# Patient Record
Sex: Male | Born: 1940 | Race: White | Hispanic: No | Marital: Single | Smoking: Never smoker
Health system: Southern US, Community
[De-identification: ages and names within clinical notes are randomized; demographics above are authoritative.]

## PROBLEM LIST (undated history)

## (undated) DIAGNOSIS — I639 Cerebral infarction, unspecified: Secondary | ICD-10-CM

## (undated) DIAGNOSIS — I10 Essential (primary) hypertension: Secondary | ICD-10-CM

## (undated) DIAGNOSIS — E781 Pure hyperglyceridemia: Secondary | ICD-10-CM

## (undated) DIAGNOSIS — F0390 Unspecified dementia without behavioral disturbance: Secondary | ICD-10-CM

## (undated) HISTORY — PX: TOTAL HIP ARTHROPLASTY: SHX124

## (undated) HISTORY — PX: HAND SURGERY: SHX662

---

## 2008-08-27 ENCOUNTER — Ambulatory Visit: Payer: Self-pay | Admitting: Orthopedic Surgery

## 2009-07-09 ENCOUNTER — Encounter: Payer: Self-pay | Admitting: Cardiovascular Disease

## 2009-07-09 LAB — CONVERTED CEMR LAB
Alkaline Phosphatase: 68 units/L
CO2: 23.7 meq/L
Calcium: 9.3 mg/dL
Chloride: 104 meq/L
Creatinine, Ser: 0.9 mg/dL
Eosinophils Relative: 0.41 %
Lymphocytes, automated: 4.97 %
Monocytes Relative: 1.23 %
Neutrophils Relative %: 6.51 %
RDW: 14.1 %
Total Bilirubin: 0.5 mg/dL

## 2009-07-14 ENCOUNTER — Telehealth: Payer: Self-pay | Admitting: Cardiovascular Disease

## 2009-07-18 ENCOUNTER — Ambulatory Visit: Payer: Self-pay | Admitting: Cardiovascular Disease

## 2009-07-18 DIAGNOSIS — R0602 Shortness of breath: Secondary | ICD-10-CM

## 2009-07-22 ENCOUNTER — Encounter: Payer: Self-pay | Admitting: Cardiovascular Disease

## 2009-07-24 ENCOUNTER — Encounter: Payer: Self-pay | Admitting: Cardiovascular Disease

## 2009-08-04 ENCOUNTER — Telehealth (INDEPENDENT_AMBULATORY_CARE_PROVIDER_SITE_OTHER): Payer: Self-pay

## 2009-08-05 ENCOUNTER — Encounter: Payer: Self-pay | Admitting: Cardiology

## 2009-08-05 ENCOUNTER — Ambulatory Visit: Payer: Self-pay | Admitting: Cardiovascular Disease

## 2009-08-05 ENCOUNTER — Encounter (HOSPITAL_COMMUNITY): Admission: RE | Admit: 2009-08-05 | Discharge: 2009-10-14 | Payer: Self-pay | Admitting: Cardiovascular Disease

## 2009-08-05 ENCOUNTER — Ambulatory Visit: Payer: Self-pay

## 2009-08-05 ENCOUNTER — Ambulatory Visit: Payer: Self-pay | Admitting: Cardiology

## 2009-08-05 ENCOUNTER — Encounter (INDEPENDENT_AMBULATORY_CARE_PROVIDER_SITE_OTHER): Payer: Self-pay | Admitting: *Deleted

## 2009-08-19 LAB — CONVERTED CEMR LAB
LDL Cholesterol: 86 mg/dL (ref 0–99)
Total CHOL/HDL Ratio: 5
Triglycerides: 195 mg/dL — ABNORMAL HIGH (ref 0.0–149.0)

## 2009-08-26 ENCOUNTER — Ambulatory Visit: Payer: Self-pay | Admitting: Cardiovascular Disease

## 2009-10-14 ENCOUNTER — Ambulatory Visit: Payer: Self-pay | Admitting: Specialist

## 2010-02-11 ENCOUNTER — Telehealth: Payer: Self-pay | Admitting: Cardiovascular Disease

## 2010-05-12 NOTE — Progress Notes (Signed)
Summary: RX  Phone Note Refill Request Call back at Home Phone [phone] Message from:  Patient on February 11, 2010 9:46 AM  Refills Requested: Medication #1:  SIMVASTATIN 20 MG TABS Take one tablet by mouth daily at bedtime.   Notes: PT CAN GET 2 OF THESE AT A TIME AND IT WILL SAVE HIM MONEY PT WOULD LIKE A WRITTEN RX-PT WOULD ALSO LIKE TO KNOW IF IT IS RECOMMENED TO DO ANOTHER SCAN OF HIS HEART  Initial call taken by: Harlon Flor,  February 11, 2010 9:47 AM    Prescriptions: SIMVASTATIN 20 MG TABS (SIMVASTATIN) Take one tablet by mouth daily at bedtime  #60 x 4   Entered by:   Bishop Dublin, CMA   Authorized by:   Dossie Arbour MD   Signed by:   Bishop Dublin, CMA on 02/12/2010   Method used:   Print then Give to Patient   RxID:   [id number]   Appended Document: RX No need for repeat CT. This is not something that should  change. If his breathing gets significantly worse (a significant change), he may want to talk with PMD or pulmonary  Appended Document: RX Attempted to notify pt.  LM with co-worker to have pt call back. EWJ  Appended Document: RX Called spoke with pt advised of Dr Windell Hummingbird recommendations.  EWJ

## 2010-05-12 NOTE — Assessment & Plan Note (Signed)
Summary: NP6   Visit Type:  new patient Referring Provider:  self Primary Provider:  n/a  CC:  short of breath at time, no cp, GERD is a plus, and no edema..  History of Present Illness: 70 year old male with past medical history of left hip replacement, carpal tunnel release, chronic right shoulder pain with history of cortisone shot, with shortness of breath over the past year.  Kelly [name] states that he exercises for 20 minutes every morning. He does curls and military lifts when his shoulder does bother him. He has not noticed any increased shortness of breath with biking. When he walks outside his house, upstairs he gets significant shortness of breath. He was seen at urgent care and was told to start a proton pump inhibitor. He states that this has helped his breathing moderately and is also help his voice which seemed to be quivering sometimes.  He states that he snores he is uncertain if he has obstructive sleep apnea. He has a has some occasional chest tightness. He does not take any blood pressure medications and does not check his blood pressure at home.  He has never smoked before, does not have diabetes. He states that his mother and father did not have any underlying coronary artery disease.  Preventive Screening-Counseling & Management  Alcohol-Tobacco     Alcohol drinks/day: 0     Smoking Status: never  Caffeine-Diet-Exercise     Caffeine use/day: no     Does Patient Exercise: yes      Drug Use:  no.    Current Problems (verified): 1)  Shortness of Breath  (ICD-786.05)  Current Medications (verified): 1)  Aspirin 81 Mg Tbec (Aspirin) .... Take One Tablet By Mouth Daily 2)  Prevacid 24hr 15 Mg Cpdr (Lansoprazole) .Marland Kitchen.. 1 By Mouth Once Daily  Allergies (verified): No Known Drug Allergies  Past History:  Past Surgical History: replace left hip  hand surgery  Family History: Mother: deceased 68: healthy Father: deceased 38: CHF, HTN  Social History: Full  Time Divorced  Tobacco Use - No.  Alcohol Use - no Regular Exercise - yes Drug Use - no Alcohol drinks/day:  0 Smoking Status:  never Caffeine use/day:  no Does Patient Exercise:  yes Drug Use:  no  Review of Systems       The patient complains of weight gain and dyspnea on exertion.  The patient denies fever, weight loss, vision loss, decreased hearing, hoarseness, chest pain, syncope, prolonged cough, abdominal pain, incontinence, muscle weakness, depression, and enlarged lymph nodes.    Vital Signs:  Patient profile:   70 year old male Height:      74 inches Weight:      261.50 pounds BMI:     33.70 Pulse rate:   64 / minute Pulse rhythm:   regular BP sitting:   138 / 90  (left arm) Cuff size:   large  Vitals Entered By: Mercer Pod (July 18, 2009 10:32 AM)  Physical Exam  General:  well-appearing middle-aged gentleman in no apparent distress, HEENT exam is benign, oropharynx is clear, neck is supple with no JVP or carotid bruits, heart sounds are regular with S1-S2 and no murmurs appreciated, lungs are clear to auscultation with no wheezes Rales, abdominal exam is benign, no significant edema, neurologic exam is grossly nonfocal and skin is warm and dry, pulses are equal and symmetrical in his upper and lower extremities.    EKG  Procedure date:  07/18/2009  Findings:  normal sinus rhythm with rate 80 beats per minute, nonspecific intraventricular conduction delay, left axis deviation  Impression & Recommendations:  Problem # 1:  SHORTNESS OF BREATH (ICD-786.05) etiology of his shortness of breath is uncertain. He does have an abnormal EKG. His shortness of breath seems to be new within the past month or so. He does do some exercise though he states that his bike at home does not have any resistance. His weight has been increasing over the past several years. He does also have some occasional chest tightness with exertion.  Given his EKG changes, H.,  shortness of breath and chest symptoms, we have ordered a stress test. We will discuss the findings with him and check his cholesterol at the same time.  We have asked him to monitor his blood pressure as his diastolic pressure is borderline elevated. He states at home it is typically well controlled.  He is currently on aspirin. No smoking history.  His updated medication list for this problem includes:    Aspirin 81 Mg Tbec (Aspirin) .Marland Kitchen... Take one tablet by mouth daily  Patient Instructions: 1)  Your physician recommends that you schedule a follow-up appointment in: May 2011 2)  Your physician recommends that you return for a FASTING lipid profile: LIPID and CMET depending on results from [facility] 3)  Your physician has requested that you have an exercise stress myoview.  For further information please visit [email or web address]  Please follow instruction sheet, as given.

## 2010-05-12 NOTE — Progress Notes (Signed)
Summary: Work-in  Phone Note Call from Patient Call back at [employer] [phone]   Caller: Daughter Call For: Mariah Milling Summary of Call: Patient's daughter called today wanting patient seen this week, he would be a new patient.  He was seen at Urgent Care last week at hospital and still is having problems with shortness of breath.  Daugthter thinks it might be cardiac related and wanted to know if you could work patient in this week to be seen?  Was also told at urgent care that he had elevated BP, patient doesn't have primary MD and has never seen a cardiologist before. Initial call taken by: [place],  July 14, 2009 10:08 AM  Follow-up for Phone Call        Called patient and he will be a work-in on Friday morning.

## 2010-05-12 NOTE — Progress Notes (Signed)
Summary: Nuc. Pre-Procedure  Phone Note Outgoing Call Call back at [facility] Phone [phone]   Call placed by: Irean Hong, RN,  August 04, 2009 2:25 PM Summary of Call: Left message with information on Myoview Information Sheet (see scanned document for details).      Nuclear Med Background Indications for Stress Test: Evaluation for Ischemia, Abnormal EKG     Symptoms: Chest Tightness, Chest Tightness with Exertion, DOE  Symptoms Comments: Chronic shoulder pain.   Nuclear Pre-Procedure Height (in): 74

## 2010-05-12 NOTE — Assessment & Plan Note (Signed)
Summary: rov   Visit Type:  rov Referring Provider:  self Primary Provider:  n/a  CC:  short of breath.  History of Present Illness: 70 year old male with past medical history of left hip replacement, carpal tunnel release, chronic right shoulder pain with history of cortisone shot, with shortness of breath over the past year.  Kelly [name] returns for followup of his stress test. He treadmill for a little bit over 4 minutes, did not reach peak heart rate, only achieve 76% of his maximum predicted heart rate. There was no significant ischemia noted. He felt very short of breath on the treadmill and was unable to go any further.  He states that he continues to have shortness of breath with exertion. This is new in the past several months and he is quite bothered by it. He states that his weight has not changed significantly over the past year.  He is concerned about an x-ray that was done at urgent care that showed a elevated right hemidiaphragm and he wonders if this could be contributing to his symptoms.  He has never smoked before, does not have diabetes. He states that his mother and father did not have any underlying coronary artery disease.  Current Problems (verified): 1)  Shortness of Breath  (ICD-786.05)  Current Medications (verified): 1)  Aspirin 81 Mg Tbec (Aspirin) .... Take One Tablet By Mouth Daily 2)  Prevacid 24hr 15 Mg Cpdr (Lansoprazole) .Marland Kitchen.. 1 By Mouth Once Daily  Allergies (verified): No Known Drug Allergies  Past History:  Past Surgical History: Last updated: August 03, 2009 replace left hip  hand surgery  Family History: Last updated: Aug 03, 2009 Mother: deceased 77: healthy Father: deceased 13: CHF, HTN  Social History: Last updated: August 03, 2009 Full Time Divorced  Tobacco Use - No.  Alcohol Use - no Regular Exercise - yes Drug Use - no  Risk Factors: Alcohol Use: 0 (08/03/2009) Caffeine Use: no (August 03, 2009) Exercise: yes (August 03, 2009)  Risk  Factors: Smoking Status: never (Aug 03, 2009)  Review of Systems       The patient complains of dyspnea on exertion.  The patient denies fever, weight loss, weight gain, vision loss, decreased hearing, hoarseness, chest pain, syncope, peripheral edema, prolonged cough, abdominal pain, incontinence, muscle weakness, depression, and enlarged lymph nodes.    Vital Signs:  Patient profile:   70 year old male Height:      74 inches Weight:      268.50 pounds BMI:     34.60 Pulse rate:   64 / minute Pulse rhythm:   regular BP sitting:   140 / 90  (left arm) Cuff size:   large  Vitals Entered By: Mercer Pod (Aug 26, 2009 2:26 PM)  Physical Exam  General:  Middle-aged gentleman in no apparent distress, HEN TMs benign oropharynx is clear, neck is supple with no JVP or carotid bruits, heart sounds are regular with S1-S2 and no murmurs appreciated, lungs are clear to auscultation with no wheezes or rales, he does have low breath sounds on the left compared to the right at the bases, abdominal exam is benign, no significant lower extremity edema, equal and symmetrical pulses in his upper and lower extremities.   Impression & Recommendations:  Problem # 1:  SHORTNESS OF BREATH (ICD-786.05) etiology of his shortness of breath is uncertain. I am concerned about the elevated right hemidiaphragm and possible pleural effusion. He states a white area was seen on his chest x-ray at the right diaphragm region. I will order a CT scan with  noncontrast to be done as soon as possible.   I've asked him to start aspirin daily basis.  His updated medication list for this problem includes:    Aspirin 81 Mg Tbec (Aspirin) .Marland Kitchen... Take two tablets by mouth daily  Orders: CT Scan  (CT Scan)  Patient Instructions: 1)  Non-Cardiac CT scanning, (CAT scanning), is a noninvasive, special x-ray that produces cross-sectional images of the body using x-rays and a computer. CT scans help physicians diagnose and  treat medical conditions. CT scans provide greater clarity and reveal more details than regular x-ray exams. Scheduled for today 08/26/09 at [facility]. 2)  CT scan results show mildly elevated right hemidiaphragm.  There is no fluid present in the lung.  CT also shows some coronary artery disease, so Dr Mariah Milling would like for you to start taking Simvastatin 20mg  once daily and start taking 81mg  ASA 2 tablets daily. Prescriptions: SIMVASTATIN 20 MG TABS (SIMVASTATIN) Take one tablet by mouth daily at bedtime  #30 x 6   Entered by:   Cloyde Reams RN   Authorized by:   Dossie Arbour MD   Signed by:   Cloyde Reams RN on 08/27/2009   Method used:   Print then Give to Patient   RxID:   [id number]

## 2010-05-12 NOTE — Letter (Signed)
Summary: Outpatient Coinsurance Notice   Outpatient Coinsurance Notice   Imported By: Marylou Mccoy 08/08/2009 16:48:44  _____________________________________________________________________  External Attachment:    Type:   Image     Comment:   External Document

## 2010-05-12 NOTE — Progress Notes (Signed)
Summary: PHI  PHI   Imported By: Harlon Flor 07/24/2009 11:49:36  _____________________________________________________________________  External Attachment:    Type:   Image     Comment:   External Document

## 2010-05-12 NOTE — Assessment & Plan Note (Signed)
Summary: Cardiology Nuclear Study  Nuclear Med Background Indications for Stress Test: Evaluation for Ischemia, Abnormal EKG     Symptoms: Chest Tightness, Chest Tightness with Exertion, DOE, Palpitations  Symptoms Comments: Chronic shoulder pain.   Nuclear Pre-Procedure Caffeine/Decaff Intake: None NPO After: 6:30 PM Lungs: clear IV 0.9% NS with Angio Cath: 20g     IV Site: (R) Hand IV Started by: Irean Hong RN Chest Size (in) 48     Height (in): 74 Weight (lb): 262 BMI: 33.76  Nuclear Med Study 1 or 2 day study:  1 day     Stress Test Type:  Eugenie Birks Reading MD:  Marca Ancona, MD     Referring MD:  T.Gollan Resting Radionuclide:  Technetium 46m Tetrofosmin     Resting Radionuclide Dose:  11 mCi  Stress Radionuclide:  Technetium 74m Tetrofosmin     Stress Radionuclide Dose:  33 mCi   Stress Protocol Exercise Time (min):  4:19 min     Max HR:  113 bpm     Predicted Max HR:  149 bpm  Max Systolic BP: 198 mm Hg     Percent Max HR:  75.84 %     METS: 6.2 Rate Pressure Product:  16109  Lexiscan: 0.4 mg   Stress Test Technologist:  Milana Na EMT-P     Nuclear Technologist:  Domenic Polite CNMT  Rest Procedure  Myocardial perfusion imaging was performed at rest 45 minutes following the intravenous administration of Myoview Technetium 34m Tetrofosmin.  Stress Procedure  The patient exercised for 4:19. The patient stopped due to fatigue and denied any chest pain.  There were no significant ST-T wave changes and occ pvcs.  Myoview was injected at peak exercise and myocardial perfusion imaging was performed after a brief delay.  QPS Raw Data Images:  Mild diaphragmatic attenuation.  Normal left ventricular size. Stress Images:  Mild inferior perfusion defect. Rest Images:  Mild inferior perfusion defect.  Subtraction (SDS):  MIld fixed inferior perfusion defect.  Transient Ischemic Dilatation:  1.03  (Normal <1.22)  Lung/Heart Ratio:  .35  (Normal  <0.45)  Quantitative Gated Spect Images QGS EDV:  115 ml QGS ESV:  57 ml QGS EF:  51 % QGS cine images:  Mild global hypokinesis.    Overall Impression  Exercise Capacity: Lexiscan study BP Response: Normal blood pressure response. Clinical Symptoms: Headache ECG Impression: NSR with RBBB. No significant change with infusion.  Overall Impression: Mild fixed inferior perfusion defect most likely is due to diaphragmatic attenuation.  No ischemia.  Low risk study.  Overall Impression Comments: Mild global hypokinesis with EF 51%.   Appended Document: Cardiology Nuclear Study Pt aware of result. EWJ

## 2010-10-22 ENCOUNTER — Encounter: Payer: Self-pay | Admitting: Cardiovascular Disease

## 2010-10-26 ENCOUNTER — Ambulatory Visit (INDEPENDENT_AMBULATORY_CARE_PROVIDER_SITE_OTHER): Payer: Medicare Other | Admitting: Cardiovascular Disease

## 2010-10-26 ENCOUNTER — Encounter: Payer: Self-pay | Admitting: Cardiovascular Disease

## 2010-10-26 DIAGNOSIS — R0602 Shortness of breath: Secondary | ICD-10-CM

## 2010-10-26 DIAGNOSIS — E785 Hyperlipidemia, unspecified: Secondary | ICD-10-CM

## 2010-10-26 DIAGNOSIS — I2510 Atherosclerotic heart disease of native coronary artery without angina pectoris: Secondary | ICD-10-CM | POA: Insufficient documentation

## 2010-10-26 NOTE — Progress Notes (Signed)
   Patient ID: Kelly [name], male    DOB: [birth year], 70 y.o.   MRN: [id number]  HPI Comments: 70 year old male with past medical history of left hip replacement, carpal tunnel release, chronic right shoulder pain with history of cortisone shot, with shortness of breath over the past year. He presents for routine followup. CT Scan of his chest showed underlying coronary artery disease.   Previous stress test in 2011:  He treadmilled for a little bit over 4 minutes, did not reach peak heart rate, only achieve 76% of his maximum predicted heart rate. There was no significant ischemia noted. He felt very short of breath on the treadmill and was unable to go any further.   His shortness of breath has been stable. He does not exercise on a regular basis. 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 No chest pain, lightheadedness dizziness. He stopped his statin on his own.   He has never smoked before, does not have diabetes. He states that his mother and father did not have any underlying coronary artery disease.  EKG shows normal sinus rhythm with rate 69 beats a minute, right bundle branch block, left axis deviation    Outpatient Encounter Prescriptions as of 10/26/2010  Medication Sig Dispense Refill  . aspirin 81 MG tablet Take 81 mg by mouth daily.         Review of Systems  Constitutional: Negative.   HENT: Negative.   Eyes: Negative.   Respiratory: Positive for shortness of breath.   Cardiovascular: Negative.   Gastrointestinal: Negative.   Musculoskeletal: Negative.   Skin: Negative.   Neurological: Negative.   Hematological: Negative.   Psychiatric/Behavioral: Negative.      BP 136/91  Pulse 69  Ht 6\' 2"  (1.88 m)  Wt 271 lb (122.925 kg)  BMI 34.79 kg/m2  Physical Exam  Nursing note and vitals reviewed. Constitutional: He is oriented to person, place, and time. He appears well-developed  and well-nourished.       obese  HENT:  Head: Normocephalic.  Nose: Nose normal.  Mouth/Throat: Oropharynx is clear and moist.  Eyes: Conjunctivae are normal. Pupils are equal, round, and reactive to light.  Neck: Normal range of motion. Neck supple. No JVD present.  Cardiovascular: Normal rate, regular rhythm, S1 normal, S2 normal, normal heart sounds and intact distal pulses.  Exam reveals no gallop and no friction rub.   No murmur heard. Pulmonary/Chest: Effort normal and breath sounds normal. No respiratory distress. He has no wheezes. He has no rales. He exhibits no tenderness.  Abdominal: Soft. Bowel sounds are normal. He exhibits no distension. There is no tenderness.  Musculoskeletal: Normal range of motion. He exhibits no edema and no tenderness.  Lymphadenopathy:    He has no cervical adenopathy.  Neurological: He is alert and oriented to person, place, and time. Coordination normal.  Skin: Skin is warm and dry. No rash noted. No erythema.  Psychiatric: He has a normal mood and affect. His behavior is normal. Judgment and thought content normal.           Assessment and Plan

## 2010-10-26 NOTE — Patient Instructions (Addendum)
You are doing well. No medication changes were made.  Please call us if you have new issues that need to be addressed before your next appt.  We will call you for a follow up Appt. In 12 months Your physician recommends that you return for a FASTING lipid profile: On Wednesday this week (lipid/lft/bmp):

## 2010-10-26 NOTE — Assessment & Plan Note (Signed)
Shortness of breath is stable. He attributes his shortness of breath to his mildly elevated right diaphragm. CT Scan was essentially normal apart from underlying coronary artery disease.

## 2010-10-26 NOTE — Assessment & Plan Note (Signed)
Cholesterol pending this week. We'll consider restarting simvastatin or Lipitor.

## 2010-10-26 NOTE — Assessment & Plan Note (Signed)
We had previously started him on a statin. He stopped this on his own. He does not have a primary care physician. We will check a cholesterol, fasting, in several days time and call him with the numbers. Ideally he should be on a statin.

## 2010-10-28 ENCOUNTER — Other Ambulatory Visit (INDEPENDENT_AMBULATORY_CARE_PROVIDER_SITE_OTHER): Payer: Medicare Other | Admitting: *Deleted

## 2010-10-28 DIAGNOSIS — E785 Hyperlipidemia, unspecified: Secondary | ICD-10-CM

## 2010-10-28 DIAGNOSIS — I2510 Atherosclerotic heart disease of native coronary artery without angina pectoris: Secondary | ICD-10-CM

## 2010-10-28 DIAGNOSIS — R0602 Shortness of breath: Secondary | ICD-10-CM

## 2010-10-29 LAB — HEPATIC FUNCTION PANEL
AST: 27 U/L (ref 0–37)
Alkaline Phosphatase: 78 U/L (ref 39–117)
Bilirubin, Direct: 0.2 mg/dL (ref 0.0–0.3)
Indirect Bilirubin: 0.5 mg/dL (ref 0.0–0.9)
Total Bilirubin: 0.7 mg/dL (ref 0.3–1.2)

## 2010-10-29 LAB — BASIC METABOLIC PANEL
BUN: 11 mg/dL (ref 6–23)
CO2: 21 mEq/L (ref 19–32)
Calcium: 8.9 mg/dL (ref 8.4–10.5)
Creat: 0.84 mg/dL (ref 0.50–1.35)
Glucose, Bld: 123 mg/dL — ABNORMAL HIGH (ref 70–99)

## 2010-10-29 LAB — LIPID PANEL: Cholesterol: 170 mg/dL (ref 0–200)

## 2010-11-09 ENCOUNTER — Telehealth: Payer: Self-pay | Admitting: *Deleted

## 2010-11-09 DIAGNOSIS — E785 Hyperlipidemia, unspecified: Secondary | ICD-10-CM

## 2010-11-09 NOTE — Telephone Encounter (Signed)
Pt left msg on vm regarding his lab results that were done 10/28/10, he has not been called with results. I called pt to give results, but chol is elevated and will need recommendation of what pt needs to do. Please advise. Thanks.

## 2010-11-10 NOTE — Telephone Encounter (Signed)
Would start lipitor 10 mg daily with recheck in 3 months Target chol <150

## 2010-11-11 MED ORDER — SIMVASTATIN 20 MG PO TABS: 20.0000 mg | ORAL_TABLET | Freq: Every evening | ORAL | Status: DC

## 2010-11-11 NOTE — Telephone Encounter (Signed)
Notified pt of results and recc. Per last note, you stated that if lipids elevated pt could restart simva or lipitor, and pt does not want to take lipitor her would rather take simva. He was on 20mg  previously, I sent in rx for this, are you ok with this. Thanks.

## 2010-11-15 NOTE — Telephone Encounter (Signed)
Sounds good. Recheck lipids in 3 to 6 months.

## 2010-11-16 NOTE — Telephone Encounter (Signed)
Marchelle Folks, will you schedule pt for lipid/lft in 3 to 6 months, thanks.

## 2014-05-16 ENCOUNTER — Ambulatory Visit: Payer: Self-pay | Admitting: Unknown Physician Specialty

## 2015-01-19 ENCOUNTER — Encounter: Payer: Self-pay | Admitting: Emergency Medicine

## 2015-01-19 ENCOUNTER — Emergency Department: Payer: Medicare Other

## 2015-01-19 ENCOUNTER — Observation Stay
Admission: EM | Admit: 2015-01-19 | Discharge: 2015-01-20 | Disposition: A | Discharge status: Home / Self Care | Payer: Medicare Other | Attending: Internal Medicine | Admitting: Internal Medicine

## 2015-01-19 DIAGNOSIS — E781 Pure hyperglyceridemia: Secondary | ICD-10-CM | POA: Insufficient documentation

## 2015-01-19 DIAGNOSIS — Z79899 Other long term (current) drug therapy: Secondary | ICD-10-CM | POA: Diagnosis not present

## 2015-01-19 DIAGNOSIS — I639 Cerebral infarction, unspecified: Secondary | ICD-10-CM

## 2015-01-19 DIAGNOSIS — Z96642 Presence of left artificial hip joint: Secondary | ICD-10-CM | POA: Diagnosis not present

## 2015-01-19 DIAGNOSIS — G459 Transient cerebral ischemic attack, unspecified: Principal | ICD-10-CM | POA: Diagnosis present

## 2015-01-19 DIAGNOSIS — R0602 Shortness of breath: Secondary | ICD-10-CM | POA: Insufficient documentation

## 2015-01-19 DIAGNOSIS — Z7982 Long term (current) use of aspirin: Secondary | ICD-10-CM | POA: Insufficient documentation

## 2015-01-19 DIAGNOSIS — E785 Hyperlipidemia, unspecified: Secondary | ICD-10-CM | POA: Diagnosis not present

## 2015-01-19 DIAGNOSIS — I2510 Atherosclerotic heart disease of native coronary artery without angina pectoris: Secondary | ICD-10-CM | POA: Diagnosis not present

## 2015-01-19 DIAGNOSIS — Z8249 Family history of ischemic heart disease and other diseases of the circulatory system: Secondary | ICD-10-CM | POA: Diagnosis not present

## 2015-01-19 DIAGNOSIS — I6523 Occlusion and stenosis of bilateral carotid arteries: Secondary | ICD-10-CM | POA: Diagnosis not present

## 2015-01-19 DIAGNOSIS — I739 Peripheral vascular disease, unspecified: Secondary | ICD-10-CM | POA: Insufficient documentation

## 2015-01-19 DIAGNOSIS — R4701 Aphasia: Secondary | ICD-10-CM

## 2015-01-19 HISTORY — DX: Pure hyperglyceridemia: E78.1

## 2015-01-19 LAB — URINE DRUG SCREEN, QUALITATIVE (ARMC ONLY)
Amphetamines, Ur Screen: NOT DETECTED
Barbiturates, Ur Screen: NOT DETECTED
Benzodiazepine, Ur Scrn: NOT DETECTED
COCAINE METABOLITE, UR ~~LOC~~: NOT DETECTED
Cannabinoid 50 Ng, Ur ~~LOC~~: NOT DETECTED
MDMA (ECSTASY) UR SCREEN: NOT DETECTED
METHADONE SCREEN, URINE: NOT DETECTED
Opiate, Ur Screen: NOT DETECTED
Phencyclidine (PCP) Ur S: NOT DETECTED
TRICYCLIC, UR SCREEN: NOT DETECTED

## 2015-01-19 LAB — CBC
HCT: 49.5 % (ref 40.0–52.0)
Hemoglobin: 16.6 g/dL (ref 13.0–18.0)
MCH: 31.4 pg (ref 26.0–34.0)
MCHC: 33.5 g/dL (ref 32.0–36.0)
MCV: 93.5 fL (ref 80.0–100.0)
PLATELETS: 184 10*3/uL (ref 150–440)
RBC: 5.3 MIL/uL (ref 4.40–5.90)
RDW: 13.6 % (ref 11.5–14.5)
WBC: 11.2 10*3/uL — AB (ref 3.8–10.6)

## 2015-01-19 LAB — COMPREHENSIVE METABOLIC PANEL
ALT: 34 U/L (ref 17–63)
ANION GAP: 7 (ref 5–15)
AST: 29 U/L (ref 15–41)
Albumin: 4.2 g/dL (ref 3.5–5.0)
Alkaline Phosphatase: 78 U/L (ref 38–126)
BUN: 19 mg/dL (ref 6–20)
CHLORIDE: 104 mmol/L (ref 101–111)
CO2: 24 mmol/L (ref 22–32)
CREATININE: 1.08 mg/dL (ref 0.61–1.24)
Calcium: 9.1 mg/dL (ref 8.9–10.3)
GFR calc non Af Amer: >60 mL/min (ref >60)
Glucose, Bld: 107 mg/dL — ABNORMAL HIGH (ref 65–99)
Potassium: 3.8 mmol/L (ref 3.5–5.1)
SODIUM: 135 mmol/L (ref 135–145)
Total Bilirubin: 0.9 mg/dL (ref 0.3–1.2)
Total Protein: 7.6 g/dL (ref 6.5–8.1)

## 2015-01-19 LAB — DIFFERENTIAL
BASOS PCT: 1 %
Basophils Absolute: 0.2 10*3/uL — ABNORMAL HIGH (ref 0–0.1)
Eosinophils Absolute: 0.1 10*3/uL (ref 0–0.7)
Eosinophils Relative: 1 %
Lymphocytes Relative: 39 %
Lymphs Abs: 4.4 10*3/uL — ABNORMAL HIGH (ref 1.0–3.6)
MONO ABS: 1.2 10*3/uL — AB (ref 0.2–1.0)
Monocytes Relative: 10 %
NEUTROS ABS: 5.4 10*3/uL (ref 1.4–6.5)
NEUTROS PCT: 49 %

## 2015-01-19 LAB — PROTIME-INR
INR: 1
PROTHROMBIN TIME: 13.4 s (ref 11.4–15.0)

## 2015-01-19 LAB — APTT: APTT: 30 s (ref 24–36)

## 2015-01-19 LAB — TROPONIN I: Troponin I: <0.03 ng/mL (ref <0.031)

## 2015-01-19 LAB — GLUCOSE, CAPILLARY: Glucose-Capillary: 114 mg/dL — ABNORMAL HIGH (ref 65–99)

## 2015-01-19 LAB — TSH: TSH: 6.802 u[IU]/mL — AB (ref 0.350–4.500)

## 2015-01-19 LAB — ETHANOL: Alcohol, Ethyl (B): <5 mg/dL (ref <5)

## 2015-01-19 MED ORDER — ENOXAPARIN SODIUM 40 MG/0.4ML ~~LOC~~ SOLN
40.0000 mg | SUBCUTANEOUS | Status: DC
  Administered 2015-01-19: 40 mg via SUBCUTANEOUS
  Filled 2015-01-19: qty 0.4

## 2015-01-19 MED ORDER — ACETAMINOPHEN 325 MG PO TABS: 650.0000 mg | ORAL_TABLET | Freq: Four times a day (QID) | ORAL | Status: DC | PRN

## 2015-01-19 MED ORDER — SODIUM CHLORIDE 0.9 % IJ SOLN
3.0000 mL | Freq: Two times a day (BID) | INTRAMUSCULAR | Status: DC
  Administered 2015-01-19 – 2015-01-20 (×2): 3 mL via INTRAVENOUS

## 2015-01-19 MED ORDER — SODIUM CHLORIDE 0.9 % IV SOLN: 250.0000 mL | INTRAVENOUS | Status: DC | PRN

## 2015-01-19 MED ORDER — ACETAMINOPHEN 650 MG RE SUPP: 650.0000 mg | Freq: Four times a day (QID) | RECTAL | Status: DC | PRN

## 2015-01-19 MED ORDER — ONDANSETRON HCL 4 MG PO TABS: 4.0000 mg | ORAL_TABLET | Freq: Four times a day (QID) | ORAL | Status: DC | PRN

## 2015-01-19 MED ORDER — SODIUM CHLORIDE 0.9 % IJ SOLN: 3.0000 mL | INTRAMUSCULAR | Status: DC | PRN

## 2015-01-19 MED ORDER — ASPIRIN EC 81 MG PO TBEC
81.0000 mg | DELAYED_RELEASE_TABLET | Freq: Every day | ORAL | Status: DC
Start: 2015-01-19 — End: 2015-01-20
  Administered 2015-01-19 – 2015-01-20 (×2): 81 mg via ORAL
  Filled 2015-01-19 (×2): qty 1

## 2015-01-19 MED ORDER — ONDANSETRON HCL 4 MG/2ML IJ SOLN: 4.0000 mg | Freq: Four times a day (QID) | INTRAMUSCULAR | Status: DC | PRN

## 2015-01-19 NOTE — ED Notes (Signed)
Patient transported to CT 

## 2015-01-19 NOTE — H&P (Signed)
[facility] [place] at [facility]   PATIENT NAME: Kelly [name]    MR#:  [id number]  DATE OF BIRTH:  08/22/1940  DATE OF ADMISSION:  01/19/2015  PRIMARY CARE PHYSICIAN: Einar Crow M.D.  REQUESTING/REFERRING PHYSICIAN: Ella Bodo MD  CHIEF COMPLAINT:   Chief Complaint  Patient presents with  . Aphasia    HISTORY OF PRESENT ILLNESS: Kelly [name]  is a 74 y.o. male with a known history of  hypertriglyceridemia with no other medical problems according to him was brought into the emergency room after he had episode of slurred speech for 15 minutes. Patient was in his usual state of health and then he was speaking to him his daughter on the phone and his speech was slurred therefore he was brought to the emergency room. Patient had a CT scan of the head which was negative. Patient was not interested in staying in the hospital it took a lot of convincing from me and his daughter for him to stay in the hospital. He otherwise denies any chest pain shortness of breath no nausea vomiting or diarrhea  PAST MEDICAL HISTORY:   Past Medical History  Diagnosis Date  . Hypertriglyceridemia     PAST SURGICAL HISTORY:  Past Surgical History  Procedure Laterality Date  . Total hip arthroplasty      Left  . Hand surgery      SOCIAL HISTORY:  Social History  Substance Use Topics  . Smoking status: Never Smoker   . Smokeless tobacco: Never Used  . Alcohol Use: No    FAMILY HISTORY:  Family History  Problem Relation Age of Onset  . Heart failure Father   . Hypertension Father     DRUG ALLERGIES: No Known Allergies  REVIEW OF SYSTEMS:   CONSTITUTIONAL: No fever, fatigue or weakness.  EYES: No blurred or double vision.  EARS, NOSE, AND THROAT: No tinnitus or ear pain.  RESPIRATORY: No cough, shortness of breath, wheezing or hemoptysis.  CARDIOVASCULAR: No chest pain, orthopnea, edema.  GASTROINTESTINAL: No nausea, vomiting, diarrhea or abdominal  pain.  GENITOURINARY: No dysuria, hematuria.  ENDOCRINE: No polyuria, nocturia,  HEMATOLOGY: No anemia, easy bruising or bleeding SKIN: No rash or lesion. MUSCULOSKELETAL: No joint pain or arthritis.   NEUROLOGIC: No tingling, numbness, weakness.  PSYCHIATRY: No anxiety or depression.   MEDICATIONS AT HOME:  Prior to Admission medications   Patient takes intermittent aspirin but not on daily basis    PHYSICAL EXAMINATION:   VITAL SIGNS: Blood pressure 155/90, pulse 77, temperature 98 F (36.7 C), temperature source Oral, resp. rate 22, weight 125.238 kg (276 lb 1.6 oz), SpO2 94 %.  GENERAL:  74 y.o.-year-old patient lying in the bed with no acute distress.  EYES: Pupils equal, round, reactive to light and accommodation. No scleral icterus. Extraocular muscles intact.  HEENT: Head atraumatic, normocephalic. Oropharynx and nasopharynx clear.  NECK:  Supple, no jugular venous distention. No thyroid enlargement, no tenderness.  LUNGS: Normal breath sounds bilaterally, no wheezing, rales,rhonchi or crepitation. No use of accessory muscles of respiration.  CARDIOVASCULAR: S1, S2 normal. No murmurs, rubs, or gallops.  ABDOMEN: Soft, nontender, nondistended. Bowel sounds present. No organomegaly or mass.  EXTREMITIES: No pedal edema, cyanosis, or clubbing.  NEUROLOGIC: Cranial nerves II through XII are intact. Muscle strength 5/5 in all extremities. Sensation intact. Gait not checked.  PSYCHIATRIC: The patient is alert and oriented x 3.  SKIN: No obvious rash, lesion, or ulcer.   LABORATORY PANEL:   CBC  Recent Labs Lab 01/19/15 1757  WBC 11.2*  HGB 16.6  HCT 49.5  PLT 184  MCV 93.5  MCH 31.4  MCHC 33.5  RDW 13.6  LYMPHSABS 4.4*  MONOABS 1.2*  EOSABS 0.1  BASOSABS 0.2*   ------------------------------------------------------------------------------------------------------------------  Chemistries   Recent Labs Lab 01/19/15 1757  NA 135  K 3.8  CL 104  CO2 24   GLUCOSE 107*  BUN 19  CREATININE 1.08  CALCIUM 9.1  AST 29  ALT 34  ALKPHOS 78  BILITOT 0.9   ------------------------------------------------------------------------------------------------------------------ CrCl cannot be calculated (Unknown ideal weight.). ------------------------------------------------------------------------------------------------------------------ No results for input(s): TSH, T4TOTAL, T3FREE, THYROIDAB in the last 72 hours.  Invalid input(s): FREET3   Coagulation profile  Recent Labs Lab 01/19/15 1757  INR 1.00   ------------------------------------------------------------------------------------------------------------------- No results for input(s): DDIMER in the last 72 hours. -------------------------------------------------------------------------------------------------------------------  Cardiac Enzymes  Recent Labs Lab 01/19/15 1757  TROPONINI <0.03   ------------------------------------------------------------------------------------------------------------------ Invalid input(s): POCBNP  ---------------------------------------------------------------------------------------------------------------  Urinalysis No results found for: COLORURINE, APPEARANCEUR, LABSPEC, PHURINE, GLUCOSEU, HGBUR, BILIRUBINUR, KETONESUR, PROTEINUR, UROBILINOGEN, NITRITE, LEUKOCYTESUR   RADIOLOGY: Ct Head Wo Contrast  01/19/2015   ADDENDUM REPORT: 01/19/2015 17:48 ADDENDUM: These results were called by telephone at the time of interpretation on 01/19/2015 at 5:47 pm to Dr. Rockne Menghini, who verbally acknowledged these results. Electronically Signed   By: Ted Mcalpine M.D.   On: 01/19/2015 17:48  01/19/2015   CLINICAL DATA:  Suspected stroke.  EXAM: CT HEAD WITHOUT CONTRAST  TECHNIQUE: Contiguous axial images were obtained from the base of the skull through the vertex without intravenous contrast.  COMPARISON:  None.  FINDINGS: No mass effect  or midline shift. No evidence of acute intracranial hemorrhage, or infarction. No abnormal extra-axial fluid collections. Gray-white matter differentiation is normal. Basal cisterns are preserved. There is brain parenchymal atrophy and mild chronic small vessel disease changes.  No depressed skull fractures. Visualized paranasal sinuses and mastoid air cells are not opacified.  IMPRESSION: No acute intracranial abnormality.  Atrophy, chronic microvascular disease.  Electronically Signed: By: Ted Mcalpine M.D. On: 01/19/2015 17:41    EKG: Orders placed or performed during the hospital encounter of 01/19/15  . ED EKG  . ED EKG  . EKG 12-Lead  . EKG 12-Lead  . EKG 12-Lead  . EKG 12-Lead    IMPRESSION AND PLAN: Patient is a 74 year old white male presents with speech difficulties now speech back to normal  1. TIA: At this time will am place patient under observation and obtain carotid Dopplers as well as echocardiogram of the heart. We'll monitor him on telemetry start him on aspirin   2. Hypertriglyceridemia check a fasting lipid panel in the a.m. start him on cholesterol-lowering medication   All the records are reviewed and case discussed with ED provider. Management plans discussed with the patient, family and they are in agreement.  CODE STATUS: Full    TOTAL TIME TAKING CARE OF THIS PATIENT: 55 minutes.    Auburn Bilberry M.D on 01/19/2015 at 7:20 PM  Between 7am to 6pm - Pager - [phone]  After 6pm go to [email or web address] - password [email or web address] [facility]  [facility]  Office  [phone]  CC: Primary care physician; No primary care provider on file.

## 2015-01-19 NOTE — ED Notes (Addendum)
Pt's daughter reports she talked to patient at 5 and pt had difficulty finding correct words to use. Pt's brother last saw pt at 1315 and pt was at baseline. Pt alert and oriented in room, able to answer questions appropriately. Pt neurologically intact, slight left facial droop noted.

## 2015-01-19 NOTE — ED Notes (Signed)
Dr Etheleen Nicks at bedside with pt

## 2015-01-19 NOTE — ED Provider Notes (Signed)
[facility] Emergency Department Provider Note  ____________________________________________  Time seen: Approximately 6:00 PM  I have reviewed the triage vital signs and the nursing notes.   HISTORY  Chief Complaint Aphasia    HPI Kelly [name] is a 74 y.o. male , otherwise healthy, presenting with expressive aphasia. Patient and his family reports that he was last seen normal at 1:15 after lunch. At 4:15, he was speaking with his family when he started to have expressive aphasia. He was aware that this is happening. He denies any associated headache, trauma, alcohol intake or drug use. He denies any changes in vision, gait, numbness tingling or weakness. At this time, he and the family feel that his symptoms have completely resolved.   History reviewed. No pertinent past medical history.  Patient Active Problem List   Diagnosis Date Noted  . CAD (coronary artery disease) 10/26/2010  . Hyperlipidemia 10/26/2010  . SHORTNESS OF BREATH 07/18/2009    Past Surgical History  Procedure Laterality Date  . Total hip arthroplasty      Left  . Hand surgery      Current Outpatient Rx  Name  Route  Sig  Dispense  Refill  . aspirin 81 MG chewable tablet   Oral   Chew 81 mg by mouth daily.         Marland Kitchen EXPIRED: simvastatin (ZOCOR) 20 MG tablet   Oral   Take 1 tablet (20 mg total) by mouth every evening.   60 tablet   3     Allergies Review of patient's allergies indicates no known allergies.  Family History  Problem Relation Age of Onset  . Heart failure Father   . Hypertension Father     Social History Social History  Substance Use Topics  . Smoking status: Never Smoker   . Smokeless tobacco: Never Used  . Alcohol Use: No    Review of Systems Constitutional: No fever/chills area and no syncope or lightheadedness. Eyes: No visual changes. ENT: No sore throat. Cardiovascular: Denies chest pain, palpitations. Respiratory: Denies  shortness of breath.  No cough. Gastrointestinal: No abdominal pain.  No nausea, no vomiting.  No diarrhea.  No constipation. Genitourinary: Negative for dysuria. Musculoskeletal: Negative for back pain. Skin: Negative for rash. Neurological: Negative for headaches, focal weakness or numbness. Positive for expressive aphasia. Negative for visual changes. Negative for changes in gait.  10-point ROS otherwise negative.  ____________________________________________   PHYSICAL EXAM:  VITAL SIGNS: ED Triage Vitals  Enc Vitals Group     BP 01/19/15 1734 152/90 mmHg     Pulse Rate 01/19/15 1734 75     Resp 01/19/15 1734 16     Temp 01/19/15 1734 98 F (36.7 C)     Temp Source 01/19/15 1734 Oral     SpO2 01/19/15 1734 95 %     Weight 01/19/15 1734 276 lb 1.6 oz (125.238 kg)     Height --      Head Cir --      Peak Flow --      Pain Score 01/19/15 1737 0     Pain Loc --      Pain Edu? --      Excl. in GC? --     Constitutional: Alert and oriented. Well appearing and in no acute distress. Answer question appropriately. Eyes: Conjunctivae are normal.  EOMI. Head: Atraumatic. Nose: No congestion/rhinnorhea. Mouth/Throat: Mucous membranes are moist.  Neck: No stridor.  Supple.  No JVD. Cardiovascular: Normal rate,  regular rhythm. No murmurs, rubs or gallops.  Respiratory: Normal respiratory effort.  No retractions. Lungs CTAB.  No wheezes, rales or ronchi. Gastrointestinal: Soft and nontender. No distention. No peritoneal signs. Musculoskeletal: No LE edema.  Neurologic: Alert and oriented 3. Speech is clear. Naming and repetition are intact. Face and smile symmetric. EOMI and PERRLA. No nystagmus. Tongue is midline. Normal cheek puff. No pronator drift. 5 out of 5 grip, biceps, triceps, hip flexors, plantar flexion and dorsiflexion. Normal sensation to light touch in the bilateral upper and lower extremities, and face. Normal heel-to-shin. Skin:  Skin is warm, dry and intact. No  rash noted. Psychiatric: Mood and affect are normal. Speech and behavior are normal.  Normal judgement.  ____________________________________________   LABS (all labs ordered are listed, but only abnormal results are displayed)  Labs Reviewed  CBC - Abnormal; Notable for the following:    WBC 11.2 (*)    All other components within normal limits  DIFFERENTIAL - Abnormal; Notable for the following:    Lymphs Abs 4.4 (*)    Monocytes Absolute 1.2 (*)    Basophils Absolute 0.2 (*)    All other components within normal limits  COMPREHENSIVE METABOLIC PANEL - Abnormal; Notable for the following:    Glucose, Bld 107 (*)    All other components within normal limits  GLUCOSE, CAPILLARY - Abnormal; Notable for the following:    Glucose-Capillary 114 (*)    All other components within normal limits  PROTIME-INR  APTT  TROPONIN I  ETHANOL  URINE DRUG SCREEN, QUALITATIVE ([facility] ONLY)  CBG MONITORING, ED   ____________________________________________  EKG  ED ECG REPORT I, Rockne Menghini, the attending physician, personally viewed and interpreted this ECG.   Date: 01/19/2015  EKG Time: 1735  Rate: 74  Rhythm: RBBB, nsr  Axis: Leftward  Intervals:first-degree A-V block   ST&T Change: No ST elevation. Nonspecific T-wave inversion in V1.  ____________________________________________  RADIOLOGY  Ct Head Wo Contrast  01/19/2015   ADDENDUM REPORT: 01/19/2015 17:48 ADDENDUM: These results were called by telephone at the time of interpretation on 01/19/2015 at 5:47 pm to Dr. Rockne Menghini, who verbally acknowledged these results. Electronically Signed   By: Ted Mcalpine M.D.   On: 01/19/2015 17:48  01/19/2015   CLINICAL DATA:  Suspected stroke.  EXAM: CT HEAD WITHOUT CONTRAST  TECHNIQUE: Contiguous axial images were obtained from the base of the skull through the vertex without intravenous contrast.  COMPARISON:  None.  FINDINGS: No mass effect or midline shift. No  evidence of acute intracranial hemorrhage, or infarction. No abnormal extra-axial fluid collections. Gray-white matter differentiation is normal. Basal cisterns are preserved. There is brain parenchymal atrophy and mild chronic small vessel disease changes.  No depressed skull fractures. Visualized paranasal sinuses and mastoid air cells are not opacified.  IMPRESSION: No acute intracranial abnormality.  Atrophy, chronic microvascular disease.  Electronically Signed: By: Ted Mcalpine M.D. On: 01/19/2015 17:41    ____________________________________________   PROCEDURES  Procedure(s) performed: None  Critical Care performed: No ____________________________________________   INITIAL IMPRESSION / ASSESSMENT AND PLAN / ED COURSE  Pertinent labs & imaging results that were available during my care of the patient were reviewed by me and considered in my medical decision making (see chart for details).  74 y.o. male presenting with resolved episode of expressive aphasia. Consider TIA has most likely etiology. We'll also evaluate for alcohol or drug abuse. We'll evaluate for electrolyte abnormality. At this time, the patient has a negative CT  of the head and has no focal neurologic findings on exam, no changes in speech. Anticipate admission.  ____________________________________________  FINAL CLINICAL IMPRESSION(S) / ED DIAGNOSES  Final diagnoses:  Expressive aphasia  Transient cerebral ischemia, unspecified transient cerebral ischemia type      NEW MEDICATIONS STARTED DURING THIS VISIT:  New Prescriptions   No medications on file     Rockne Menghini, MD 01/19/15 1903

## 2015-01-19 NOTE — ED Notes (Signed)
SOC computer rolled into room and hooked up. MD notified SOC in place and order needs to be made for consult. No new orders at this time.

## 2015-01-19 NOTE — ED Notes (Signed)
Pt given [place] sandwich tray, pt cont to deny need to void, no distress cont to monitor

## 2015-01-19 NOTE — ED Notes (Signed)
MD at bedside. 

## 2015-01-20 ENCOUNTER — Observation Stay: Payer: Medicare Other

## 2015-01-20 ENCOUNTER — Observation Stay
Admit: 2015-01-20 | Discharge: 2015-01-20 | Disposition: A | Discharge status: Home / Self Care | Payer: Medicare Other | Attending: Internal Medicine | Admitting: Internal Medicine

## 2015-01-20 LAB — HEMOGLOBIN A1C: Hgb A1c MFr Bld: 5.9 % (ref 4.0–6.0)

## 2015-01-20 LAB — LIPID PANEL
CHOL/HDL RATIO: 6.5 ratio
CHOLESTEROL: 163 mg/dL (ref 0–200)
HDL: 25 mg/dL — ABNORMAL LOW (ref >40)
LDL CALC: 74 mg/dL (ref 0–99)
Triglycerides: 321 mg/dL — ABNORMAL HIGH (ref <150)
VLDL: 64 mg/dL — AB (ref 0–40)

## 2015-01-20 NOTE — Progress Notes (Signed)
*  PRELIMINARY RESULTS* Echocardiogram 2D Echocardiogram has been performed.  Georgann Housekeeper Hege 01/20/2015, 9:36 AM

## 2015-01-20 NOTE — Discharge Instructions (Signed)
You will need to follow up with your primary care provider this week to discuss your test results.   DIET:  Low fat, Low cholesterol diet  DISCHARGE CONDITION:  Stable  ACTIVITY:  Activity as tolerated  OXYGEN:  Home Oxygen: No.   Oxygen Delivery: room air  DISCHARGE LOCATION:  home   If you experience worsening of your admission symptoms, develop shortness of breath, life threatening emergency, suicidal or homicidal thoughts you must seek medical attention immediately by calling 911 or calling your MD immediately  if symptoms less severe.  You Must read complete instructions/literature along with all the possible adverse reactions/side effects for all the Medicines you take and that have been prescribed to you. Take any new Medicines after you have completely understood and accpet all the possible adverse reactions/side effects.   Please note  You were cared for by a hospitalist during your hospital stay. If you have any questions about your discharge medications or the care you received while you were in the hospital after you are discharged, you can call the unit and asked to speak with the hospitalist on call if the hospitalist that took care of you is not available. Once you are discharged, your primary care physician will handle any further medical issues. Please note that NO REFILLS for any discharge medications will be authorized once you are discharged, as it is imperative that you return to your primary care physician (or establish a relationship with a primary care physician if you do not have one) for your aftercare needs so that they can reassess your need for medications and monitor your lab values.

## 2015-01-20 NOTE — Progress Notes (Signed)
Received call from MRI.  Pt refusing to finish his MRI and refusing to put his telemetry leads back on.  Dr. Clent Ridges made aware.  Orson Ape, RN

## 2015-01-20 NOTE — Progress Notes (Signed)
Pt demanding to leave.  When daughter arrive discussed discharge instructions and medications with pt and his daughter. No questions at this time.  Pt snatched IV off before nurse could remove.  Pt transported via car by his daughter. Orson Ape, RN

## 2015-01-20 NOTE — Plan of Care (Signed)
Problem: Discharge/Transitional Outcomes Goal: Barriers To Progression Addressed/Resolved Outcome: Progressing Pt is from home alone No medical history at this time [employer]s [employer] in [facility] Passed swallow screen, Moderate Fall Goal: Other Discharge Outcomes/Goals Outcome: Progressing Pt is alert and oriented, no c/o pain at this time. NIH of 1, slight left facial droop. Very rarely, mixes up words in a sentence. Urine drug screen is negative. SR with BBB on telemetry. Family at bedside, supportive.

## 2015-01-23 NOTE — Discharge Summary (Signed)
[facility] - [place] at [facility]  DISCHARGE SUMMARY   PATIENT NAME: Kelly [name]    MR#:  [id number]  DATE OF BIRTH:  12/28/1940  DATE OF ADMISSION:  01/19/2015 ADMITTING PHYSICIAN: Auburn Bilberry, MD  DATE OF DISCHARGE: 01/20/2015  PRIMARY CARE PHYSICIAN: No primary care Diontay Rosencrans on file.    ADMISSION DIAGNOSIS:  TIA (transient ischemic attack) [G45.9] Expressive aphasia [R47.01] Transient cerebral ischemia, unspecified transient cerebral ischemia type [G45.9]  DISCHARGE DIAGNOSIS:  Active Problems:   TIA (transient ischemic attack)   SECONDARY DIAGNOSIS:   Past Medical History  Diagnosis Date  . Hypertriglyceridemia     HOSPITAL COURSE:   #1 TIA: Patient presented with an episode of a phase which lasted for 15-30 minutes. At the time of presentation he was asymptomatic. He was started on aspirin on admission. He had no events on telemetry. His urine drug screen was negative. His cholesterol was acceptable with an LDL of 74 but triglycerides elevated at 321. Other labs were within normal limits with the exception of his TSH which is 6.82. CT scan of the head was negative for acute event but did show chronic microvascular disease and atrophy. Blood pressures were slightly elevated up to 166/100 during the admission. On the day of discharge he stated that he would leave at noon regardless of any test results. We were able to have tests performed, but results were not available at the time of discharge. He will need to follow-up with his primary care Chandler Swiderski for test results and he agreed to do this. Prior to discharge she had an MRI which showed global atrophy and microvascular disease, this test was not completed because the patient refused to complete the test. 2-D echocardiogram was unremarkable ejection fraction 60%. Carotid Doppler showed bilateral atherosclerotic disease without significant stenosis.  #3 hypertriglyceridemia: Patient's  triglycerides were elevated at 321 even while fasting. Other lipids in acceptable range. He would benefit from a statin medication and should continue on simvastatin.   DISCHARGE CONDITIONS:   Stable. He is asymptomatic at the time of discharge. He refuses to stay for completion of tests, discontinued his MRI study in the middle of the study. Refuses to stay for test results. States that he will follow up with primary care.  CONSULTS OBTAINED:  Treatment Team:  Auburn Bilberry, MD  DRUG ALLERGIES:  No Known Allergies  DISCHARGE MEDICATIONS:   Discharge Medication List as of 01/20/2015 11:41 AM    CONTINUE these medications which have NOT CHANGED   Details  aspirin 81 MG chewable tablet Chew 81 mg by mouth daily., Until Discontinued, Historical Med    simvastatin (ZOCOR) 20 MG tablet Take 1 tablet (20 mg total) by mouth every evening., Starting 11/11/2010, Until Thu 11/11/11, Normal         DISCHARGE INSTRUCTIONS:   Stable. Asymptomatic. Heart healthy diet. No home health needs.   If you experience worsening of your admission symptoms, develop shortness of breath, life threatening emergency, suicidal or homicidal thoughts you must seek medical attention immediately by calling 911 or calling your MD immediately  if symptoms less severe.  You Must read complete instructions/literature along with all the possible adverse reactions/side effects for all the Medicines you take and that have been prescribed to you. Take any new Medicines after you have completely understood and accept all the possible adverse reactions/side effects.   Please note  You were cared for by a hospitalist during your hospital stay. If you have any questions about your  discharge medications or the care you received while you were in the hospital after you are discharged, you can call the unit and asked to speak with the hospitalist on call if the hospitalist that took care of you is not available. Once you are  discharged, your primary care physician will handle any further medical issues. Please note that NO REFILLS for any discharge medications will be authorized once you are discharged, as it is imperative that you return to your primary care physician (or establish a relationship with a primary care physician if you do not have one) for your aftercare needs so that they can reassess your need for medications and monitor your lab values.    Today   CHIEF COMPLAINT:   Chief Complaint  Patient presents with  . Aphasia    HISTORY OF PRESENT ILLNESS:  Kelly [name] is a 74 y.o. male with a known history of hypertriglyceridemia with no other medical problems according to him was brought into the emergency room after he had episode of slurred speech for 15 minutes. Patient was in his usual state of health and then he was speaking to him his daughter on the phone and his speech was slurred therefore he was brought to the emergency room. Patient had a CT scan of the head which was negative. Patient was not interested in staying in the hospital it took a lot of convincing from me and his daughter for him to stay in the hospital. He otherwise denies any chest pain shortness of breath no nausea vomiting or diarrhea  VITAL SIGNS:  Blood pressure 155/80, pulse 65, temperature 97.9 F (36.6 C), temperature source Oral, resp. rate 15, height 6\' 2"  (1.88 m), weight 125.238 kg (276 lb 1.6 oz), SpO2 96 %.  I/O:  No intake or output data in the 24 hours ending 01/23/15 1051  PHYSICAL EXAMINATION:  GENERAL:  74 y.o.-year-old patient lying in the bed with no acute distress.  EYES: Pupils equal, round, reactive to light and accommodation. No scleral icterus. Extraocular muscles intact.  HEENT: Head atraumatic, normocephalic. Oropharynx and nasopharynx clear.  NECK:  Supple, no jugular venous distention. No thyroid enlargement, no tenderness.  LUNGS: Normal breath sounds bilaterally, no wheezing, rales,rhonchi or  crepitation. No use of accessory muscles of respiration.  CARDIOVASCULAR: S1, S2 normal. No murmurs, rubs, or gallops.  ABDOMEN: Soft, non-tender, non-distended. Bowel sounds present. No organomegaly or mass.  EXTREMITIES: No pedal edema, cyanosis, or clubbing.  NEUROLOGIC: Cranial nerves II through XII are intact. Muscle strength 5/5 in all extremities. Sensation intact. Gait not checked.  PSYCHIATRIC: The patient is alert and oriented x 3. Anxious SKIN: No obvious rash, lesion, or ulcer.   DATA REVIEW:   CBC  Recent Labs Lab 01/19/15 1757  WBC 11.2*  HGB 16.6  HCT 49.5  PLT 184    Chemistries   Recent Labs Lab 01/19/15 1757  NA 135  K 3.8  CL 104  CO2 24  GLUCOSE 107*  BUN 19  CREATININE 1.08  CALCIUM 9.1  AST 29  ALT 34  ALKPHOS 78  BILITOT 0.9    Cardiac Enzymes  Recent Labs Lab 01/19/15 1757  TROPONINI <0.03    Microbiology Results  No results found for this or any previous visit.  RADIOLOGY:  No results found.  EKG:   Orders placed or performed during the hospital encounter of 01/19/15  . ED EKG  . ED EKG  . EKG 12-Lead  . EKG 12-Lead  . EKG 12-Lead  .  EKG 12-Lead      Management plans discussed with the patient, family and they are in agreement.  CODE STATUS: Full  TOTAL TIME TAKING CARE OF THIS PATIENT: 35 minutes.  Greater than 50% of time spent in care coordination and counseling. Care plan was discussed with the patient and his daughter at the bedside. They were encouraged to stay for completion of tests and for discussion of test results but they refused.  Elby Showers M.D on 01/23/2015 at 10:51 AM  Between 7am to 6pm - Pager - [phone]  After 6pm go to [email or web address] - password [email or web address]  [facility]  Office  [phone]  CC: Primary care physician; No primary care Tayden Duran on file.

## 2016-03-03 ENCOUNTER — Emergency Department: Payer: Medicare Other

## 2016-03-03 ENCOUNTER — Inpatient Hospital Stay (HOSPITAL_BASED_OUTPATIENT_CLINIC_OR_DEPARTMENT_OTHER)
Admit: 2016-03-03 | Discharge: 2016-03-03 | Disposition: A | Discharge status: Home / Self Care | Payer: Medicare Other | Attending: Internal Medicine | Admitting: Internal Medicine

## 2016-03-03 ENCOUNTER — Observation Stay: Payer: Medicare Other

## 2016-03-03 ENCOUNTER — Inpatient Hospital Stay
Admission: EM | Admit: 2016-03-03 | Discharge: 2016-03-05 | DRG: 558 | Disposition: A | Discharge status: Skilled Nursing Facility | Payer: Medicare Other | Attending: Internal Medicine | Admitting: Internal Medicine

## 2016-03-03 DIAGNOSIS — M6281 Muscle weakness (generalized): Secondary | ICD-10-CM

## 2016-03-03 DIAGNOSIS — M6282 Rhabdomyolysis: Principal | ICD-10-CM | POA: Diagnosis present

## 2016-03-03 DIAGNOSIS — Z8249 Family history of ischemic heart disease and other diseases of the circulatory system: Secondary | ICD-10-CM

## 2016-03-03 DIAGNOSIS — I2510 Atherosclerotic heart disease of native coronary artery without angina pectoris: Secondary | ICD-10-CM | POA: Diagnosis present

## 2016-03-03 DIAGNOSIS — E781 Pure hyperglyceridemia: Secondary | ICD-10-CM | POA: Diagnosis present

## 2016-03-03 DIAGNOSIS — E785 Hyperlipidemia, unspecified: Secondary | ICD-10-CM | POA: Diagnosis present

## 2016-03-03 DIAGNOSIS — Z96642 Presence of left artificial hip joint: Secondary | ICD-10-CM | POA: Diagnosis present

## 2016-03-03 DIAGNOSIS — R531 Weakness: Secondary | ICD-10-CM | POA: Diagnosis not present

## 2016-03-03 DIAGNOSIS — G459 Transient cerebral ischemic attack, unspecified: Secondary | ICD-10-CM | POA: Diagnosis not present

## 2016-03-03 DIAGNOSIS — R748 Abnormal levels of other serum enzymes: Secondary | ICD-10-CM | POA: Diagnosis present

## 2016-03-03 DIAGNOSIS — M810 Age-related osteoporosis without current pathological fracture: Secondary | ICD-10-CM | POA: Diagnosis present

## 2016-03-03 DIAGNOSIS — Y92012 Bathroom of single-family (private) house as the place of occurrence of the external cause: Secondary | ICD-10-CM

## 2016-03-03 DIAGNOSIS — W1830XA Fall on same level, unspecified, initial encounter: Secondary | ICD-10-CM | POA: Diagnosis present

## 2016-03-03 DIAGNOSIS — R262 Difficulty in walking, not elsewhere classified: Secondary | ICD-10-CM

## 2016-03-03 DIAGNOSIS — Z7982 Long term (current) use of aspirin: Secondary | ICD-10-CM

## 2016-03-03 DIAGNOSIS — I639 Cerebral infarction, unspecified: Secondary | ICD-10-CM

## 2016-03-03 LAB — CBC WITH DIFFERENTIAL/PLATELET
BASOS ABS: 0.2 10*3/uL — AB (ref 0–0.1)
Basophils Relative: 1 %
EOS PCT: 0 %
Eosinophils Absolute: 0.1 10*3/uL (ref 0–0.7)
HCT: 49 % (ref 40.0–52.0)
HEMOGLOBIN: 16.6 g/dL (ref 13.0–18.0)
LYMPHS ABS: 2.1 10*3/uL (ref 1.0–3.6)
LYMPHS PCT: 11 %
MCH: 32 pg (ref 26.0–34.0)
MCHC: 33.9 g/dL (ref 32.0–36.0)
MCV: 94.4 fL (ref 80.0–100.0)
Monocytes Absolute: 1.3 10*3/uL — ABNORMAL HIGH (ref 0.2–1.0)
Monocytes Relative: 7 %
NEUTROS ABS: 14.7 10*3/uL — AB (ref 1.4–6.5)
NEUTROS PCT: 81 %
PLATELETS: 148 10*3/uL — AB (ref 150–440)
RBC: 5.19 MIL/uL (ref 4.40–5.90)
RDW: 14.5 % (ref 11.5–14.5)
WBC: 18.4 10*3/uL — AB (ref 3.8–10.6)

## 2016-03-03 LAB — URINALYSIS COMPLETE WITH MICROSCOPIC (ARMC ONLY)
BACTERIA UA: NONE SEEN
Bilirubin Urine: NEGATIVE
GLUCOSE, UA: NEGATIVE mg/dL
KETONES UR: NEGATIVE mg/dL
LEUKOCYTES UA: NEGATIVE
NITRITE: NEGATIVE
PROTEIN: 30 mg/dL — AB
SPECIFIC GRAVITY, URINE: 1.029 (ref 1.005–1.030)
Squamous Epithelial / LPF: NONE SEEN
pH: 5 (ref 5.0–8.0)

## 2016-03-03 LAB — COMPREHENSIVE METABOLIC PANEL
ALT: 53 U/L (ref 17–63)
ANION GAP: 10 (ref 5–15)
AST: 133 U/L — AB (ref 15–41)
Albumin: 3.7 g/dL (ref 3.5–5.0)
Alkaline Phosphatase: 63 U/L (ref 38–126)
BILIRUBIN TOTAL: 1.8 mg/dL — AB (ref 0.3–1.2)
BUN: 33 mg/dL — ABNORMAL HIGH (ref 6–20)
CHLORIDE: 107 mmol/L (ref 101–111)
CO2: 21 mmol/L — ABNORMAL LOW (ref 22–32)
Calcium: 8.6 mg/dL — ABNORMAL LOW (ref 8.9–10.3)
Creatinine, Ser: 1.23 mg/dL (ref 0.61–1.24)
GFR, EST NON AFRICAN AMERICAN: 55 mL/min — AB (ref >60)
Glucose, Bld: 134 mg/dL — ABNORMAL HIGH (ref 65–99)
POTASSIUM: 4.2 mmol/L (ref 3.5–5.1)
Sodium: 138 mmol/L (ref 135–145)
TOTAL PROTEIN: 7.2 g/dL (ref 6.5–8.1)

## 2016-03-03 LAB — URINE DRUG SCREEN, QUALITATIVE (ARMC ONLY)
AMPHETAMINES, UR SCREEN: NOT DETECTED
Barbiturates, Ur Screen: NOT DETECTED
Benzodiazepine, Ur Scrn: NOT DETECTED
CANNABINOID 50 NG, UR ~~LOC~~: NOT DETECTED
Cocaine Metabolite,Ur ~~LOC~~: NOT DETECTED
MDMA (ECSTASY) UR SCREEN: NOT DETECTED
Methadone Scn, Ur: NOT DETECTED
OPIATE, UR SCREEN: NOT DETECTED
PHENCYCLIDINE (PCP) UR S: NOT DETECTED
Tricyclic, Ur Screen: NOT DETECTED

## 2016-03-03 LAB — ECHOCARDIOGRAM COMPLETE
HEIGHTINCHES: 74 in
Weight: 4096 oz

## 2016-03-03 LAB — ETHANOL: Alcohol, Ethyl (B): <5 mg/dL (ref <5)

## 2016-03-03 LAB — CK: Total CK: 2954 U/L — ABNORMAL HIGH (ref 49–397)

## 2016-03-03 LAB — TROPONIN I
TROPONIN I: 0.15 ng/mL — AB (ref <0.03)
Troponin I: 0.28 ng/mL (ref <0.03)

## 2016-03-03 MED ORDER — ENOXAPARIN SODIUM 40 MG/0.4ML ~~LOC~~ SOLN
40.0000 mg | SUBCUTANEOUS | Status: DC
Start: 2016-03-03 — End: 2016-03-05
  Administered 2016-03-04: 22:00:00 40 mg via SUBCUTANEOUS
  Filled 2016-03-03 (×3): qty 0.4

## 2016-03-03 MED ORDER — ACETAMINOPHEN 325 MG PO TABS: 650.0000 mg | ORAL_TABLET | ORAL | Status: DC | PRN

## 2016-03-03 MED ORDER — STROKE: EARLY STAGES OF RECOVERY BOOK
Freq: Once | Status: AC
  Administered 2016-03-03: 18:00:00

## 2016-03-03 MED ORDER — SENNOSIDES-DOCUSATE SODIUM 8.6-50 MG PO TABS: 1.0000 | ORAL_TABLET | Freq: Every evening | ORAL | Status: DC | PRN

## 2016-03-03 MED ORDER — ASPIRIN 300 MG RE SUPP: 300.0000 mg | Freq: Every day | RECTAL | Status: DC

## 2016-03-03 MED ORDER — ACETAMINOPHEN 650 MG RE SUPP: 650.0000 mg | RECTAL | Status: DC | PRN

## 2016-03-03 MED ORDER — SODIUM CHLORIDE 0.9 % IV SOLN
INTRAVENOUS | Status: DC
  Administered 2016-03-03 – 2016-03-04 (×3): via INTRAVENOUS

## 2016-03-03 MED ORDER — ASPIRIN 325 MG PO TABS
325.0000 mg | ORAL_TABLET | Freq: Every day | ORAL | Status: DC
  Administered 2016-03-03 – 2016-03-05 (×3): 325 mg via ORAL
  Filled 2016-03-03 (×3): qty 1

## 2016-03-03 NOTE — ED Triage Notes (Signed)
Pt from home via EMS, reports he fell at home 2 days ago and has been lying there without food/water. Family found pt. CBG 147. Pt unable to explain why he fell and denies any injury

## 2016-03-03 NOTE — Care Management (Signed)
Kelly [name] was out for testing, but I spoke with his daughter and grandson. They both seemed to be okay, a little rattled still, but okay. I checked on them and inquired if they needed anything and they were fine for now.

## 2016-03-03 NOTE — ED Notes (Signed)
EDP notified of pts troponin level  

## 2016-03-03 NOTE — ED Notes (Signed)
Patient transported to X-ray 

## 2016-03-03 NOTE — ED Notes (Signed)
SOC    CALLED 

## 2016-03-03 NOTE — Progress Notes (Signed)
Pt admitted from the ED at 1750. VSS. Denies pain. NIH score 1. Pt went for x ray at 1830.

## 2016-03-03 NOTE — ED Notes (Signed)
Cleaned and dressed pt. Hooked pt up to monitor.

## 2016-03-03 NOTE — Progress Notes (Signed)
Family Meeting Note  Advance Directive:yes  Today a meeting took place with the Patient., Daughter and grandson  Patient is able to participate   The following clinical team members were present during this meeting:MD  The following were discussed:Patient's diagnosis: , Patient's progosis: > 12 months and Goals for treatment: Full Code, not considering palliative care at this time  Additional follow-up to be provided: Hospitalist  Time spent during discussion:16 min  Kelly [name], Kelly ArtisAruna, MD

## 2016-03-03 NOTE — ED Notes (Signed)
CODE STROKE CALLED TO 333 

## 2016-03-03 NOTE — H&P (Signed)
[facility] - [place] at [facility]   PATIENT NAME: Kelly [name]    MR#:  [id number]  DATE OF BIRTH:  1940/07/30  DATE OF ADMISSION:  03/03/2016  PRIMARY CARE PHYSICIAN: No PCP Per Patient   REQUESTING/REFERRING PHYSICIAN: Jennye Moccasin, MD  CHIEF COMPLAINT:   fall HISTORY OF PRESENT ILLNESS:  Kelly [name]  is a 75 y.o. [name] with a known history of Coronary artery disease and hyperlipidemia, who lives alone, fell 2 days ago in the bathroom and was unable to move for several hours, finally patient was able to crawl to his living room and called his daughter who called EMS to rescue him. EMS reports the patient was covered with stool and his home was filthy. Staff has noticed left-sided weakness and left facial droop CT head is negative but troponins are elevated at 0.28 and total CK is also elevated patient is answering questions appropriately  PAST MEDICAL HISTORY:   Past Medical History:  Diagnosis Date  . Hypertriglyceridemia   Hyperlipidemia, coronary artery disease, osteoporosis  PAST SURGICAL HISTOIRY:   Past Surgical History:  Procedure Laterality Date  . HAND SURGERY    . TOTAL HIP ARTHROPLASTY     Left    SOCIAL HISTORY:   Social History  Substance Use Topics  . Smoking status: Never Smoker  . Smokeless tobacco: Never Used  . Alcohol use No    FAMILY HISTORY:   Family History  Problem Relation Age of Onset  . Heart failure Father   . Hypertension Father     DRUG ALLERGIES:  No Known Allergies  REVIEW OF SYSTEMS:  CONSTITUTIONAL: No fever, fatigue or weakness.  EYES: No blurred or double vision.  EARS, NOSE, AND THROAT: No tinnitus or ear pain.  RESPIRATORY: No cough, shortness of breath, wheezing or hemoptysis.  CARDIOVASCULAR: No chest pain, orthopnea, edema.  GASTROINTESTINAL: No nausea, vomiting, diarrhea or abdominal pain.  GENITOURINARY: No dysuria, hematuria.  ENDOCRINE: No polyuria, nocturia,  HEMATOLOGY: No  anemia, easy bruising or bleeding SKIN: No rash or lesion. MUSCULOSKELETAL: No joint pain or arthritis.  Denies any muscle cramps but has osteoarthritis and osteoporosis NEUROLOGIC: Left-sided weakness  PSYCHIATRY: No anxiety or depression.   MEDICATIONS AT HOME:   Prior to Admission medications   Medication Sig Start Date End Date Taking? Authorizing Provider  aspirin 81 MG chewable tablet Chew 81 mg by mouth daily.    Historical Provider, MD      VITAL SIGNS:  Blood pressure (!) 146/91, pulse 90, temperature 97.8 F (36.6 C), resp. rate (!) 29, height 6\' 2"  (1.88 m), weight 117.9 kg (260 lb), SpO2 100 %.  PHYSICAL EXAMINATION:  GENERAL:  75 y.o.-year-old patient lying in the bed with no acute distress.  EYES: Pupils equal, round, reactive to light and accommodation. No scleral icterus. Extraocular muscles intact.  HEENT: Head atraumatic, normocephalic. Oropharynx and nasopharynx clear.  NECK:  Supple, no jugular venous distention. No thyroid enlargement, no tenderness.  LUNGS: Normal breath sounds bilaterally, no wheezing, rales,rhonchi or crepitation. No use of accessory muscles of respiration.  CARDIOVASCULAR: S1, S2 normal. No murmurs, rubs, or gallops.  ABDOMEN: Soft, nontender, nondistended. Bowel sounds present. No organomegaly or mass.  EXTREMITIES: No pedal edema, cyanosis, or clubbing.  NEUROLOGIC: Motor 3-4 out of 5 on the left upper extremity and lower extremity rest of the extremities 5 out of 5 Sensation intact. Gait not checked.  PSYCHIATRIC: The patient is alert and oriented x 3.  SKIN: No obvious rash,  lesion, or ulcer.   LABORATORY PANEL:   CBC  Recent Labs Lab 03/03/16 1247  WBC 18.4*  HGB 16.6  HCT 49.0  PLT 148*   ------------------------------------------------------------------------------------------------------------------  Chemistries   Recent Labs Lab 03/03/16 1247  NA 138  K 4.2  CL 107  CO2 21*  GLUCOSE 134*  BUN 33*  CREATININE  1.23  CALCIUM 8.6*  AST 133*  ALT 53  ALKPHOS 63  BILITOT 1.8*   ------------------------------------------------------------------------------------------------------------------  Cardiac Enzymes  Recent Labs Lab 03/03/16 1247  TROPONINI 0.28*   ------------------------------------------------------------------------------------------------------------------  RADIOLOGY:  Ct Head Wo Contrast  Result Date: 03/03/2016 CLINICAL DATA:  Unwitnessed fall. EXAM: CT HEAD WITHOUT CONTRAST TECHNIQUE: Contiguous axial images were obtained from the base of the skull through the vertex without intravenous contrast. COMPARISON:  CT scan of January 19, 2015. FINDINGS: Brain: Mild diffuse cortical atrophy is noted. Mild chronic ischemic white matter disease is noted. No mass effect or midline shift is noted. Ventricular size is within normal limits. There is no evidence of mass lesion, hemorrhage or acute infarction. Vascular: Atherosclerosis of carotid siphons is noted. Skull: Normal. Negative for fracture or focal lesion. Sinuses/Orbits: No acute finding. Other: None. IMPRESSION: Mild diffuse cortical atrophy. Mild chronic ischemic white matter disease. No acute intracranial abnormality seen. Electronically Signed   By: Lupita RaiderJames  Green Jr, M.D.   On: 03/03/2016 13:26   Dg Chest Port 1 View  Result Date: 03/03/2016 CLINICAL DATA:  Pain following fall EXAM: PORTABLE CHEST 1 VIEW COMPARISON:  Chest CT Aug 26, 2009 FINDINGS: There is no edema or consolidation. Heart size and pulmonary vascularity are normal. No adenopathy. No pneumothorax. No bone lesions. IMPRESSION: No edema or consolidation. Electronically Signed   By: Bretta BangWilliam  Woodruff III M.D.   On: 03/03/2016 13:58   Dg Hip Unilat W Or Wo Pelvis 1 View Left  Result Date: 03/03/2016 CLINICAL DATA:  Pain following fall EXAM: DG HIP (WITH OR WITHOUT PELVIS) 2+V, LEFT COMPARISON:  None. FINDINGS: Frontal pelvis as well as frontal and lateral left hip  images were obtained. There is a total hip prosthesis on the left which appears well seated. There is no acute fracture or dislocation. There is moderate narrowing of the right hip joint. No erosive change. Bones are osteoporotic. IMPRESSION: Left total hip prosthesis well-seated. No acute fracture or dislocation. Moderate right hip joint osteoarthritic change. Bones osteoporotic. Electronically Signed   By: Bretta BangWilliam  Woodruff III M.D.   On: 03/03/2016 14:00    EKG:   Orders placed or performed during the hospital encounter of 01/19/15  . ED EKG  . ED EKG  . EKG 12-Lead  . EKG 12-Lead  . EKG 12-Lead  . EKG 12-Lead    IMPRESSION AND PLAN:   Kelly [name]  is a 75 y.o. [name] with a known history of Coronary artery disease and hyperlipidemia, who lives alone, fell 2 days ago in the bathroom and was unable to move for several hours, finally patient was able to crawl to his living room and called his daughter who called EMS to rescue him. EMS reports the patient was covered with stool and his home was filthy.  #TIA with left-sided weakness Admit to MedSurg unit Initial CT head is negative Stroke workup with MRI brain, carotid Dopplers and 2-D echocardiogram PT, OT and speech therapy evaluation Nothing by mouth, Bedside swallow evaluation Start him on aspirin Check fasting lipid panel lipid panel, TSH and hemoglobin A1c Neurology consult if needed  #Mild rhabdomyolysis from fall Hydrate  with IV fluids Monitor CK levels PT evaluation  #Elevated troponin could be from rhabdomyolysis patient denies any chest pain We'll put him on aspirin and check fasting lipid panel Cycle troponins  #History of coronary artery disease Patient is asymptomatic not on any home medications like beta blocker and statin Check fasting lipid panel Start him on aspirin and statin based on lipid panel results   All the records are reviewed and case discussed with ED provider. Management plans discussed with  the patient, family and they are in agreement.  CODE STATUS: Full code, daughter is the healthcare power of attorney  TOTAL TIME TAKING CARE OF THIS PATIENT: 45 minutes.   Note: This dictation was prepared with Dragon dictation along with smaller phrase technology. Any transcriptional errors that result from this process are unintentional.  Ramonita LabGouru, Carnita Golob M.D on 03/03/2016 at 5:05 PM  Between 7am to 6pm - Pager - [phone]  After 6pm go to [email or web address] - password [email or web address] [email or web address]  [facility]  Office  [phone]  CC: Primary care physician; No PCP Per Patient

## 2016-03-03 NOTE — ED Provider Notes (Signed)
Time Seen: Approximately 1239  I have reviewed the triage notes  Chief Complaint: Fall   History of Present Illness: Kelly [name] is a 75 y.o. male *who states have variable time frames of when he fell. EMS reports the patient fell 2 days ago and based on the home environment had been covered with stool and it was still spread out to the house. Patient's not sure exactly when the fell and was found by the family and EMS was notified. He can't explain why he fell denies any hip or any obvious head trauma or injury. He is aware of the date though is obviously in extreme report historian. Patient was cleaned here in the emergency department.  Past Medical History:  Diagnosis Date  . Hypertriglyceridemia     Patient Active Problem List   Diagnosis Date Noted  . TIA (transient ischemic attack) 01/19/2015  . CAD (coronary artery disease) 10/26/2010  . Hyperlipidemia 10/26/2010  . SHORTNESS OF BREATH 07/18/2009    Past Surgical History:  Procedure Laterality Date  . HAND SURGERY    . TOTAL HIP ARTHROPLASTY     Left    Past Surgical History:  Procedure Laterality Date  . HAND SURGERY    . TOTAL HIP ARTHROPLASTY     Left    Current Outpatient Rx  . Order #: [id number] Class: Historical Med    Allergies:  Patient has no known allergies.  Family History: Family History  Problem Relation Age of Onset  . Heart failure Father   . Hypertension Father     Social History: Social History  Substance Use Topics  . Smoking status: Never Smoker  . Smokeless tobacco: Never Used  . Alcohol use No     Review of Systems:   10 point review of systems was performed and was otherwise negative:  Constitutional: No fever Eyes: No visual disturbances ENT: No sore throat, ear pain Cardiac: No chest pain Respiratory: No shortness of breath, wheezing, or stridor Abdomen: No abdominal pain, no vomiting, No diarrhea Endocrine: No weight loss, No night sweats Extremities: No  peripheral edema, cyanosis Skin: No rashes, easy bruising Neurologic: Patient describes difficulty ambulating but can't be more specific  Urologic: No dysuria, Hematuria, or urinary frequency   Physical Exam:  ED Triage Vitals  Enc Vitals Group     BP 03/03/16 1230 (!) 156/97     Pulse Rate 03/03/16 1230 91     Resp 03/03/16 1300 (!) 31     Temp 03/03/16 1239 97.8 F (36.6 C)     Temp src --      SpO2 03/03/16 1402 100 %     Weight 03/03/16 1231 260 lb (117.9 kg)     Height 03/03/16 1231 6\' 2"  (1.88 m)     Head Circumference --      Peak Flow --      Pain Score --      Pain Loc --      Pain Edu? --      Excl. in GC? --     General: Awake , Alert , and Oriented times 3; GCS 15 Head: Normal cephalic , atraumatic Eyes: Pupils equal , round, reactive to light Nose/Throat: No nasal drainage, patent upper airway without erythema or exudate. Dry mucous membranes Neck: Supple, Full range of motion, No anterior adenopathy or palpable thyroid masses. No obvious bruits Lungs: Clear to ascultation without wheezes , rhonchi, or rales Heart: Regular rate, regular rhythm without murmurs , gallops , or  rubs Abdomen: Soft, non tender without rebound, guarding , or rigidity; bowel sounds positive and symmetric in all 4 quadrants. No organomegaly .        Extremities: 2 plus symmetric pulses. No edema, clubbing or cyanosis Neurologic: Patient has some obvious left lower extremity weakness compared to the right no asymmetry in the upper extremities  Skin: warm, dry, no rashes   Labs:   All laboratory work was reviewed including any pertinent negatives or positives listed below:  Labs Reviewed  COMPREHENSIVE METABOLIC PANEL - Abnormal; Notable for the following:       Result Value   CO2 21 (*)    Glucose, Bld 134 (*)    BUN 33 (*)    Calcium 8.6 (*)    AST 133 (*)    Total Bilirubin 1.8 (*)    GFR calc non Af Amer 55 (*)    All other components within normal limits  CBC WITH  DIFFERENTIAL/PLATELET - Abnormal; Notable for the following:    WBC 18.4 (*)    Platelets 148 (*)    Neutro Abs 14.7 (*)    Monocytes Absolute 1.3 (*)    Basophils Absolute 0.2 (*)    All other components within normal limits  TROPONIN I - Abnormal; Notable for the following:    Troponin I 0.28 (*)    All other components within normal limits  CK - Abnormal; Notable for the following:    Total CK 2,954 (*)    All other components within normal limits  URINALYSIS COMPLETEWITH MICROSCOPIC ([facility] ONLY) - Abnormal; Notable for the following:    Color, Urine AMBER (*)    APPearance HAZY (*)    Hgb urine dipstick 2+ (*)    Protein, ur 30 (*)    All other components within normal limits  ETHANOL  URINE DRUG SCREEN, QUALITATIVE ([facility] ONLY)  Patient's CK and troponin are significantly elevated  EKG:  ED ECG REPORT I, Jennye Moccasin, the attending physician, personally viewed and interpreted this ECG.  Date: 03/03/2016 EKG Time: *1234 Rate: 89 Rhythm: normal sinus rhythm QRS Axis: normal right bundle-branch block and left anterior fascicular block normal ST/T Wave abnormalities: normal Conduction Disturbances: none Narrative Interpretation: unremarkable No acute ischemic changes   Radiology:  "Ct Head Wo Contrast  Result Date: 03/03/2016 CLINICAL DATA:  Unwitnessed fall. EXAM: CT HEAD WITHOUT CONTRAST TECHNIQUE: Contiguous axial images were obtained from the base of the skull through the vertex without intravenous contrast. COMPARISON:  CT scan of January 19, 2015. FINDINGS: Brain: Mild diffuse cortical atrophy is noted. Mild chronic ischemic white matter disease is noted. No mass effect or midline shift is noted. Ventricular size is within normal limits. There is no evidence of mass lesion, hemorrhage or acute infarction. Vascular: Atherosclerosis of carotid siphons is noted. Skull: Normal. Negative for fracture or focal lesion. Sinuses/Orbits: No acute finding. Other: None.  IMPRESSION: Mild diffuse cortical atrophy. Mild chronic ischemic white matter disease. No acute intracranial abnormality seen. Electronically Signed   By: Lupita Raider, M.D.   On: 03/03/2016 13:26   Dg Chest Port 1 View  Result Date: 03/03/2016 CLINICAL DATA:  Pain following fall EXAM: PORTABLE CHEST 1 VIEW COMPARISON:  Chest CT Aug 26, 2009 FINDINGS: There is no edema or consolidation. Heart size and pulmonary vascularity are normal. No adenopathy. No pneumothorax. No bone lesions. IMPRESSION: No edema or consolidation. Electronically Signed   By: Bretta Bang III M.D.   On: 03/03/2016 13:58   Dg  Hip Unilat W Or Wo Pelvis 1 View Left  Result Date: 03/03/2016 CLINICAL DATA:  Pain following fall EXAM: DG HIP (WITH OR WITHOUT PELVIS) 2+V, LEFT COMPARISON:  None. FINDINGS: Frontal pelvis as well as frontal and lateral left hip images were obtained. There is a total hip prosthesis on the left which appears well seated. There is no acute fracture or dislocation. There is moderate narrowing of the right hip joint. No erosive change. Bones are osteoporotic. IMPRESSION: Left total hip prosthesis well-seated. No acute fracture or dislocation. Moderate right hip joint osteoarthritic change. Bones osteoporotic. Electronically Signed   By: Bretta BangWilliam  Woodruff III M.D.   On: 03/03/2016 14:00  "  I personally reviewed the radiologic studies   Critical Care:  CRITICAL CARE Performed by: Jennye MoccasinBrian S Quigley   Total critical care time: 33 minutes  Critical care time was exclusive of separately billable procedures and treating other patients.  Critical care was necessary to treat or prevent imminent or life-threatening deterioration.  Critical care was time spent personally by me on the following activities: development of treatment plan with patient and/or surrogate as well as nursing, discussions with consultants, evaluation of patient's response to treatment, examination of patient, obtaining history  from patient or surrogate, ordering and performing treatments and interventions, ordering and review of laboratory studies, ordering and review of radiographic studies, pulse oximetry and re-evaluation of patient's condition. Initiation and evaluation of treatment for a code stroke   ED Course:  Code stroke was called from patient secondary to his left lower extremity weakness that is obviously well outside of the time window for TPA therapy. Patient was evaluated by the specialist on call who agrees that the patient had a stroke and does not require any intervention at this time. The patient's troponin is also significantly elevated along with his total CK which may indicate some rhabdomyolysis. Clinical Course      Assessment:  acute cerebrovascular accident   Final Clinical Impression:   Final diagnoses:  Cerebrovascular accident (CVA), unspecified mechanism [facility](HCC)     Plan:  Inpatient            Jennye MoccasinBrian S Quigley, MD 03/03/16 1502

## 2016-03-04 ENCOUNTER — Observation Stay: Payer: Medicare Other

## 2016-03-04 DIAGNOSIS — Z7982 Long term (current) use of aspirin: Secondary | ICD-10-CM | POA: Diagnosis not present

## 2016-03-04 DIAGNOSIS — G459 Transient cerebral ischemic attack, unspecified: Secondary | ICD-10-CM

## 2016-03-04 DIAGNOSIS — W1830XA Fall on same level, unspecified, initial encounter: Secondary | ICD-10-CM | POA: Diagnosis present

## 2016-03-04 DIAGNOSIS — I2510 Atherosclerotic heart disease of native coronary artery without angina pectoris: Secondary | ICD-10-CM | POA: Diagnosis present

## 2016-03-04 DIAGNOSIS — M6282 Rhabdomyolysis: Secondary | ICD-10-CM | POA: Diagnosis present

## 2016-03-04 DIAGNOSIS — Z8249 Family history of ischemic heart disease and other diseases of the circulatory system: Secondary | ICD-10-CM | POA: Diagnosis not present

## 2016-03-04 DIAGNOSIS — E785 Hyperlipidemia, unspecified: Secondary | ICD-10-CM | POA: Diagnosis present

## 2016-03-04 DIAGNOSIS — E781 Pure hyperglyceridemia: Secondary | ICD-10-CM | POA: Diagnosis present

## 2016-03-04 DIAGNOSIS — R531 Weakness: Secondary | ICD-10-CM | POA: Diagnosis present

## 2016-03-04 DIAGNOSIS — R748 Abnormal levels of other serum enzymes: Secondary | ICD-10-CM | POA: Diagnosis present

## 2016-03-04 DIAGNOSIS — Y92012 Bathroom of single-family (private) house as the place of occurrence of the external cause: Secondary | ICD-10-CM | POA: Diagnosis not present

## 2016-03-04 DIAGNOSIS — M810 Age-related osteoporosis without current pathological fracture: Secondary | ICD-10-CM | POA: Diagnosis present

## 2016-03-04 DIAGNOSIS — Z96642 Presence of left artificial hip joint: Secondary | ICD-10-CM | POA: Diagnosis present

## 2016-03-04 LAB — TROPONIN I
TROPONIN I: 0.14 ng/mL — AB (ref <0.03)
TROPONIN I: 0.16 ng/mL — AB (ref <0.03)

## 2016-03-04 LAB — LIPID PANEL
CHOL/HDL RATIO: 5.3 ratio
CHOLESTEROL: 148 mg/dL (ref 0–200)
HDL: 28 mg/dL — ABNORMAL LOW (ref >40)
LDL Cholesterol: 94 mg/dL (ref 0–99)
Triglycerides: 129 mg/dL (ref <150)
VLDL: 26 mg/dL (ref 0–40)

## 2016-03-04 LAB — CK: Total CK: 1050 U/L — ABNORMAL HIGH (ref 49–397)

## 2016-03-04 MED ORDER — SODIUM CHLORIDE 0.9 % IV SOLN
INTRAVENOUS | Status: DC
  Administered 2016-03-04: 16:00:00 via INTRAVENOUS

## 2016-03-04 MED ORDER — HALOPERIDOL LACTATE 5 MG/ML IJ SOLN
1.0000 mg | Freq: Four times a day (QID) | INTRAMUSCULAR | Status: DC | PRN
  Administered 2016-03-04: 22:00:00 1 mg via INTRAVENOUS
  Filled 2016-03-04: qty 1

## 2016-03-04 NOTE — Consult Note (Signed)
Reason for Consult: slurry speech  Referring Physician: Dr. Amado CoeGouru  CC: slurry speech   HPI: Kelly [name] is an 75 y.o. male with a known history of Coronary artery disease and hyperlipidemia, who lives alone, fell 2 days ago in the bathroom and was unable to move for several hours, finally patient was able to crawl to his living room and called his daughter who called EMS to rescue him. Pt was on ASA 81 daily Pt was found to have L facial droop and LLE weakness   Past Medical History:  Diagnosis Date  . Hypertriglyceridemia     Past Surgical History:  Procedure Laterality Date  . HAND SURGERY    . TOTAL HIP ARTHROPLASTY     Left    Family History  Problem Relation Age of Onset  . Heart failure Father   . Hypertension Father     Social History:  reports that he has never smoked. He has never used smokeless tobacco. He reports that he does not drink alcohol or use drugs.  No Known Allergies  Medications: I have reviewed the patient's current medications.  ROS: History obtained from the patient  General ROS: negative for - chills, fatigue, fever, night sweats, weight gain or weight loss Psychological ROS: negative for - behavioral disorder, hallucinations, memory difficulties, mood swings or suicidal ideation Ophthalmic ROS: negative for - blurry vision, double vision, eye pain or loss of vision ENT ROS: negative for - epistaxis, nasal discharge, oral lesions, sore throat, tinnitus or vertigo Allergy and Immunology ROS: negative for - hives or itchy/watery eyes Hematological and Lymphatic ROS: negative for - bleeding problems, bruising or swollen lymph nodes Endocrine ROS: negative for - galactorrhea, hair pattern changes, polydipsia/polyuria or temperature intolerance Respiratory ROS: negative for - cough, hemoptysis, shortness of breath or wheezing Cardiovascular ROS: negative for - chest pain, dyspnea on exertion, edema or irregular heartbeat Gastrointestinal ROS:  negative for - abdominal pain, diarrhea, hematemesis, nausea/vomiting or stool incontinence Genito-Urinary ROS: negative for - dysuria, hematuria, incontinence or urinary frequency/urgency Musculoskeletal ROS: negative for - joint swelling or muscular weakness Neurological ROS: as noted in HPI Dermatological ROS: negative for rash and skin lesion changes  Physical Examination: Blood pressure 137/81, pulse 75, temperature 97.5 F (36.4 C), temperature source Oral, resp. rate (!) 23, height 6\' 2"  (1.88 m), weight 116.1 kg (256 lb), SpO2 94 %.    Neurological Examination Mental Status: Alert, oriented, thought content appropriate.  Speech fluent without evidence of aphasia.  Able to follow 3 step commands without difficulty. Cranial Nerves: II: Discs flat bilaterally; Visual fields grossly normal, pupils equal, round, reactive to light and accommodation III,IV, VI: ptosis not present, extra-ocular motions intact bilaterally V,VII: L facial droop VIII: hearing normal bilaterally IX,X: gag reflex present XI: bilateral shoulder shrug XII: midline tongue extension Motor: Right : Upper extremity   5/5    Left:     Upper extremity   4+/5  Lower extremity   5/5     Lower extremity   4/5 Tone and bulk:normal tone throughout; no atrophy noted Sensory: Pinprick and light touch intact throughout, bilaterally Deep Tendon Reflexes: 1+ and symmetric throughout Plantars: Right: downgoing   Left: downgoing Cerebellar: normal finger-to-nose, normal rapid alternating movements and normal heel-to-shin test Gait: not tested       Laboratory Studies:   Basic Metabolic Panel:  Recent Labs Lab 03/03/16 1247  NA 138  K 4.2  CL 107  CO2 21*  GLUCOSE 134*  BUN 33*  CREATININE 1.23  CALCIUM 8.6*    Liver Function Tests:  Recent Labs Lab 03/03/16 1247  AST 133*  ALT 53  ALKPHOS 63  BILITOT 1.8*  PROT 7.2  ALBUMIN 3.7   No results for input(s): LIPASE, AMYLASE in the last 168  hours. No results for input(s): AMMONIA in the last 168 hours.  CBC:  Recent Labs Lab 03/03/16 1247  WBC 18.4*  NEUTROABS 14.7*  HGB 16.6  HCT 49.0  MCV 94.4  PLT 148*    Cardiac Enzymes:  Recent Labs Lab 03/03/16 1247 03/03/16 1755 03/04/16 0047 03/04/16 0651  CKTOTAL 2,954*  --   --  1,050*  TROPONINI 0.28* 0.15* 0.16* 0.14*    BNP: Invalid input(s): POCBNP  CBG: No results for input(s): GLUCAP in the last 168 hours.  Microbiology: No results found for this or any previous visit.  Coagulation Studies: No results for input(s): LABPROT, INR in the last 72 hours.  Urinalysis:  Recent Labs Lab 03/03/16 1240  COLORURINE AMBER*  LABSPEC 1.029  PHURINE 5.0  GLUCOSEU NEGATIVE  HGBUR 2+*  BILIRUBINUR NEGATIVE  KETONESUR NEGATIVE  PROTEINUR 30*  NITRITE NEGATIVE  LEUKOCYTESUR NEGATIVE    Lipid Panel:     Component Value Date/Time   CHOL 148 03/04/2016 0651   TRIG 129 03/04/2016 0651   HDL 28 (L) 03/04/2016 0651   CHOLHDL 5.3 03/04/2016 0651   VLDL 26 03/04/2016 0651   LDLCALC 94 03/04/2016 0651    HgbA1C:  Lab Results  Component Value Date   HGBA1C 5.9 01/19/2015    Urine Drug Screen:     Component Value Date/Time   LABOPIA NONE DETECTED 03/03/2016 1240   COCAINSCRNUR NONE DETECTED 03/03/2016 1240   LABBENZ NONE DETECTED 03/03/2016 1240   AMPHETMU NONE DETECTED 03/03/2016 1240   THCU NONE DETECTED 03/03/2016 1240   LABBARB NONE DETECTED 03/03/2016 1240    Alcohol Level:  Recent Labs Lab 03/03/16 1247  ETH <5    Other results: EKG: normal EKG, normal sinus rhythm, unchanged from previous tracings.  Imaging: Dg Chest 2 View  Result Date: 03/03/2016 CLINICAL DATA:  Weakness EXAM: CHEST  2 VIEW COMPARISON:  03/03/2016 FINDINGS: Mild elevation of right diaphragm unchanged. No acute consolidation. Possible tiny right-sided pleural effusion. Stable heart size. Mild central congestion. No pneumothorax. IMPRESSION: 1. Possible tiny  right-sided pleural effusion. No acute consolidation. 2. Stable heart size.  Mild central vascular congestion. Electronically Signed   By: Jasmine Pang M.D.   On: 03/03/2016 20:05   Ct Head Wo Contrast  Result Date: 03/03/2016 CLINICAL DATA:  Unwitnessed fall. EXAM: CT HEAD WITHOUT CONTRAST TECHNIQUE: Contiguous axial images were obtained from the base of the skull through the vertex without intravenous contrast. COMPARISON:  CT scan of January 19, 2015. FINDINGS: Brain: Mild diffuse cortical atrophy is noted. Mild chronic ischemic white matter disease is noted. No mass effect or midline shift is noted. Ventricular size is within normal limits. There is no evidence of mass lesion, hemorrhage or acute infarction. Vascular: Atherosclerosis of carotid siphons is noted. Skull: Normal. Negative for fracture or focal lesion. Sinuses/Orbits: No acute finding. Other: None. IMPRESSION: Mild diffuse cortical atrophy. Mild chronic ischemic white matter disease. No acute intracranial abnormality seen. Electronically Signed   By: Lupita Raider, M.D.   On: 03/03/2016 13:26   Dg Chest Port 1 View  Result Date: 03/03/2016 CLINICAL DATA:  Pain following fall EXAM: PORTABLE CHEST 1 VIEW COMPARISON:  Chest CT Aug 26, 2009 FINDINGS: There is no  edema or consolidation. Heart size and pulmonary vascularity are normal. No adenopathy. No pneumothorax. No bone lesions. IMPRESSION: No edema or consolidation. Electronically Signed   By: Bretta BangWilliam  Woodruff III M.D.   On: 03/03/2016 13:58   Dg Hip Unilat W Or Wo Pelvis 1 View Left  Result Date: 03/03/2016 CLINICAL DATA:  Pain following fall EXAM: DG HIP (WITH OR WITHOUT PELVIS) 2+V, LEFT COMPARISON:  None. FINDINGS: Frontal pelvis as well as frontal and lateral left hip images were obtained. There is a total hip prosthesis on the left which appears well seated. There is no acute fracture or dislocation. There is moderate narrowing of the right hip joint. No erosive change. Bones  are osteoporotic. IMPRESSION: Left total hip prosthesis well-seated. No acute fracture or dislocation. Moderate right hip joint osteoarthritic change. Bones osteoporotic. Electronically Signed   By: Bretta BangWilliam  Woodruff III M.D.   On: 03/03/2016 14:00     Assessment/Plan:   75 y.o. male with a known history of Coronary artery disease and hyperlipidemia, who lives alone, fell 2 days ago in the bathroom and was unable to move for several hours, finally patient was able to crawl to his living room and called his daughter who called EMS to rescue him. Pt was on ASA 81 daily Pt was found to have L facial droop and LLE weakness  Awaiting MRI/MRA Will need Pt/OT Con't ASA S/p discussion with pt and wife at bedside  Pauletta BrownsZEYLIKMAN, Vinicius Brockman   03/04/2016, 11:02 AM

## 2016-03-04 NOTE — Progress Notes (Signed)
Pt is refusing to have telemetry monitoring and IV fluids and asked me to inform the MD.  MD paged... awaiting response.

## 2016-03-04 NOTE — Evaluation (Signed)
Physical Therapy Evaluation Patient Details Name: Kelly [name] MRN: [id number] DOB: 14-Jan-1941 Today's Date: 03/04/2016   History of Present Illness  Kelly [name]  is a 75 y.o. male with a known history of Coronary artery disease and hyperlipidemia, who lives alone, fell 2 days ago in the bathroom and was unable to move for several hours, finally patient was able to crawl to his living room and called his daughter who called EMS to rescue him. EMS reports the patient was covered with stool and his home was filthy. Staff has noticed left-sided weakness and left facial droop CT head is negative but troponins are elevated at 0.28 and total CK is also elevated patient is answering questions appropriately. Pt denies other falls in the last 12 months  Clinical Impression  Pt admitted with above diagnosis. Pt currently with functional limitations due to the deficits listed below (see PT Problem List). Pt demonstrates LUE/LLE weakness as well as generalized weakness with bed mobility and transfers. Pt with poor balance in standing and LLE buckling. Attempted forward/backwards stepping at EOB but pt with poor balance and LLE buckling. Unsafe to attempt further ambulation at this time. Pt will need SNF placement at discharge. Discussed referral to CIR however pt and family refusing inpatient rehab at this time. Pt will benefit from skilled PT services to address deficits in strength, balance, and mobility in order to return to full function at home.     Follow Up Recommendations SNF;Other (comment) (Pt and family refusing referral for CIR)    Equipment Recommendations  Rolling walker with 5" wheels;Other (comment) ([facility]    Recommendations for Other Services       Precautions / Restrictions Precautions Precautions: Fall Restrictions Weight Bearing Restrictions: No      Mobility  Bed Mobility Overal bed mobility: Needs Assistance Bed Mobility: Supine to Sit;Sit to Supine      Supine to sit: Mod assist Sit to supine: Min assist   General bed mobility comments: Pt requires cues for sequencing and requires assist to pull upright to sitting. Once upright good posture, balance, and midline awareness  Transfers Overall transfer level: Needs assistance Equipment used: Rolling walker (2 wheeled) Transfers: Sit to/from Stand Sit to Stand: Mod assist         General transfer comment: Pt requires cues and assistance to go from sitting to standing. Bed elevated to assist. Once upright in standing pt initially unstable but balance improves with duration in standing. Performed standing marches at EOB with LLE buckling noted and heavy UE reliance. Performed 1-2 steps forward/backwards however pt with posterior LOB. Unable/unsafe to attempt further ambulation at this time  Ambulation/Gait                Stairs            Wheelchair Mobility    Modified Rankin (Stroke Patients Only)       Balance Overall balance assessment: Needs assistance Sitting-balance support: No upper extremity supported Sitting balance-Leahy Scale: Good     Standing balance support: No upper extremity supported Standing balance-Leahy Scale: Poor Standing balance comment: Able to tolerate challenge to sitting balance. Posterior LOB in standing with minA+1 required to correct initially progressing to CGA only                             Pertinent Vitals/Pain Pain Assessment: No/denies pain    Home Living Family/patient expects to be discharged to::  Unsure Living Arrangements: Alone Available Help at Discharge: Family Type of Home: House Home Access: Stairs to enter Entrance Stairs-Rails: None Entrance Stairs-Number of Steps: 3 Home Layout: Multi-level;Other (Comment) (Split level home, must go up/down stairs) Home Equipment: Shower seat - built in;Grab bars - tub/shower (no assistive devices)      Prior Function Level of Independence: Independent          Comments: Independent with ADLs/IADLs, dri9ves     Hand Dominance   Dominant Hand: Right    Extremity/Trunk Assessment   Upper Extremity Assessment: LUE deficits/detail       LUE Deficits / Details: Pt denies numbness/tingling in LUE/LLE to light touch. L shoulder flexion is limited to approximately 100-110 degrees actively but passively obtains an additional 20-30 degrees. L elbow flexion/extension is 5/5, diminished L grip strength. 1+ spasticity in LUE. L facial droop noted during smile. Positive pronator drift LUE, decreased rapid alternating movements, and diminished L finger to nose. RUE grossly WFL and 5/5. No UE/LE clonus prseent.    Lower Extremity Assessment: LLE deficits/detail   LLE Deficits / Details: 3+/5 L hip flexion, L knee flexion 4/5, L knee extension 4+/5, L ankle DF 5/5. RLE grossly WFL 5/5 strength. No notable increase in tone in LLE     Communication   Communication: No difficulties  Cognition Arousal/Alertness: Awake/alert Behavior During Therapy: WFL for tasks assessed/performed Overall Cognitive Status: Within Functional Limits for tasks assessed                      General Comments      Exercises     Assessment/Plan    PT Assessment Patient needs continued PT services  PT Problem List Decreased strength;Decreased range of motion;Decreased activity tolerance;Decreased balance;Decreased coordination;Decreased knowledge of use of DME;Decreased safety awareness          PT Treatment Interventions DME instruction;Gait training;Stair training;Functional mobility training;Therapeutic activities;Therapeutic exercise;Balance training;Neuromuscular re-education;Patient/family education    PT Goals (Current goals can be found in the Care Plan section)  Acute Rehab PT Goals Patient Stated Goal: Return to prior level of function at home PT Goal Formulation: With patient/family Time For Goal Achievement: 03/18/16 Potential to Achieve  Goals: Good    Frequency 7X/week   Barriers to discharge Inaccessible home environment;Decreased caregiver support Lives alone, split level house requiring stairs to go to all living areas    Co-evaluation               End of Session Equipment Utilized During Treatment: Gait belt Activity Tolerance: Patient tolerated treatment well Patient left: in bed;with call bell/phone within reach;with bed alarm set;with family/visitor present           Time: 1335-1406 PT Time Calculation (min) (ACUTE ONLY): 31 min   Charges:   PT Evaluation $PT Eval Moderate Complexity: 1 Procedure     PT G Codes:       Sharalyn InkJason D Huprich PT, DPT   Huprich,Jason 03/04/2016, 3:03 PM

## 2016-03-04 NOTE — Progress Notes (Signed)
[facility] [place] at [facility]   PATIENT NAME: Kelly GlassDonald [name]    MRN#:  [id number]  DATE OF BIRTH:  1941-03-23  SUBJECTIVE:  Hospital Day: 0 days Kelly [name] is a 75 y.o. male presenting with Fall .   Overnight events: No acute events Interval Events: Still has left lower leg weakness otherwise no further complaints  REVIEW OF SYSTEMS:  CONSTITUTIONAL: No fever, fatigue or weakness.  EYES: No blurred or double vision.  EARS, NOSE, AND THROAT: No tinnitus or ear pain.  RESPIRATORY: No cough, shortness of breath, wheezing or hemoptysis.  CARDIOVASCULAR: No chest pain, orthopnea, edema.  GASTROINTESTINAL: No nausea, vomiting, diarrhea or abdominal pain.  GENITOURINARY: No dysuria, hematuria.  ENDOCRINE: No polyuria, nocturia,  HEMATOLOGY: No anemia, easy bruising or bleeding SKIN: No rash or lesion. MUSCULOSKELETAL: No joint pain or arthritis.   NEUROLOGIC: No tingling, numbness,Positive leg weakness.  PSYCHIATRY: No anxiety or depression.   DRUG ALLERGIES:  No Known Allergies  VITALS:  Blood pressure 137/81, pulse 75, temperature 97.5 F (36.4 C), temperature source Oral, resp. rate (!) 23, height 6\' 2"  (1.88 m), weight 116.1 kg (256 lb), SpO2 94 %.  PHYSICAL EXAMINATION:  VITAL SIGNS: Vitals:   03/04/16 0409 03/04/16 0602  BP: (!) 162/89 137/81  Pulse: 79 75  Resp: (!) 21 (!) 23  Temp: 98.4 F (36.9 C) 97.5 F (36.4 C)   GENERAL:75 y.o.male currently in no acute distress.  HEAD: Normocephalic, atraumatic.  EYES: Pupils equal, round, reactive to light. Extraocular muscles intact. No scleral icterus.  MOUTH: Moist mucosal membrane. Dentition intact. No abscess noted.  EAR, NOSE, THROAT: Clear without exudates. No external lesions.  NECK: Supple. No thyromegaly. No nodules. No JVD.  PULMONARY: Clear to ascultation, without wheeze rails or rhonci. No use of accessory muscles, Good respiratory effort. good air entry bilaterally CHEST:  Nontender to palpation.  CARDIOVASCULAR: S1 and S2. Regular rate and rhythm. No murmurs, rubs, or gallops. No edema. Pedal pulses 2+ bilaterally.  GASTROINTESTINAL: Soft, nontender, nondistended. No masses. Positive bowel sounds. No hepatosplenomegaly.  MUSCULOSKELETAL: No swelling, clubbing, or edema. Range of motion full in all extremities.  NEUROLOGIC: Cranial nerves II through XII are intact. Strength 4/5 proximal left lower extremity Sensation intact. Reflexes intact.  SKIN: No ulceration, lesions, rashes, or cyanosis. Skin warm and dry. Turgor intact.  PSYCHIATRIC: Mood, affect within normal limits. The patient is awake, alert and oriented x 3. Insight, judgment intact.      LABORATORY PANEL:   CBC  Recent Labs Lab 03/03/16 1247  WBC 18.4*  HGB 16.6  HCT 49.0  PLT 148*   ------------------------------------------------------------------------------------------------------------------  Chemistries   Recent Labs Lab 03/03/16 1247  NA 138  K 4.2  CL 107  CO2 21*  GLUCOSE 134*  BUN 33*  CREATININE 1.23  CALCIUM 8.6*  AST 133*  ALT 53  ALKPHOS 63  BILITOT 1.8*   ------------------------------------------------------------------------------------------------------------------  Cardiac Enzymes  Recent Labs Lab 03/04/16 0651  TROPONINI 0.14*   ------------------------------------------------------------------------------------------------------------------  RADIOLOGY:  Dg Chest 2 View  Result Date: 03/03/2016 CLINICAL DATA:  Weakness EXAM: CHEST  2 VIEW COMPARISON:  03/03/2016 FINDINGS: Mild elevation of right diaphragm unchanged. No acute consolidation. Possible tiny right-sided pleural effusion. Stable heart size. Mild central congestion. No pneumothorax. IMPRESSION: 1. Possible tiny right-sided pleural effusion. No acute consolidation. 2. Stable heart size.  Mild central vascular congestion. Electronically Signed   By: Jasmine PangKim  Fujinaga M.D.   On: 03/03/2016  20:05   Ct Head Wo  Contrast  Result Date: 03/03/2016 CLINICAL DATA:  Unwitnessed fall. EXAM: CT HEAD WITHOUT CONTRAST TECHNIQUE: Contiguous axial images were obtained from the base of the skull through the vertex without intravenous contrast. COMPARISON:  CT scan of January 19, 2015. FINDINGS: Brain: Mild diffuse cortical atrophy is noted. Mild chronic ischemic white matter disease is noted. No mass effect or midline shift is noted. Ventricular size is within normal limits. There is no evidence of mass lesion, hemorrhage or acute infarction. Vascular: Atherosclerosis of carotid siphons is noted. Skull: Normal. Negative for fracture or focal lesion. Sinuses/Orbits: No acute finding. Other: None. IMPRESSION: Mild diffuse cortical atrophy. Mild chronic ischemic white matter disease. No acute intracranial abnormality seen. Electronically Signed   By: Lupita RaiderJames  Green Jr, M.D.   On: 03/03/2016 13:26   Dg Chest Port 1 View  Result Date: 03/03/2016 CLINICAL DATA:  Pain following fall EXAM: PORTABLE CHEST 1 VIEW COMPARISON:  Chest CT Aug 26, 2009 FINDINGS: There is no edema or consolidation. Heart size and pulmonary vascularity are normal. No adenopathy. No pneumothorax. No bone lesions. IMPRESSION: No edema or consolidation. Electronically Signed   By: Bretta BangWilliam  Woodruff III M.D.   On: 03/03/2016 13:58   Dg Hip Unilat W Or Wo Pelvis 1 View Left  Result Date: 03/03/2016 CLINICAL DATA:  Pain following fall EXAM: DG HIP (WITH OR WITHOUT PELVIS) 2+V, LEFT COMPARISON:  None. FINDINGS: Frontal pelvis as well as frontal and lateral left hip images were obtained. There is a total hip prosthesis on the left which appears well seated. There is no acute fracture or dislocation. There is moderate narrowing of the right hip joint. No erosive change. Bones are osteoporotic. IMPRESSION: Left total hip prosthesis well-seated. No acute fracture or dislocation. Moderate right hip joint osteoarthritic change. Bones osteoporotic.  Electronically Signed   By: Bretta BangWilliam  Woodruff III M.D.   On: 03/03/2016 14:00    EKG:   Orders placed or performed during the hospital encounter of 01/19/15  . ED EKG  . ED EKG  . EKG 12-Lead  . EKG 12-Lead  . EKG 12-Lead  . EKG 12-Lead    ASSESSMENT AND PLAN:   Kelly [name] is a 75 y.o. male presenting with Fall . Admitted 03/03/2016 : Day #: 0 days 1. CVA: MRI pending continue aspirin appreciate neurology input PT, OT, statin therapy - LDL 94, carotid ultrasound performed 2. Rhabdomyolysis: IV fluid hydration. Renal function 3. Elevated troponin: Downward trending   All the records are reviewed and case discussed with Care Management/Social Workerr. Management plans discussed with the patient, family and they are in agreement.  CODE STATUS: full TOTAL TIME TAKING CARE OF THIS PATIENT: 28 minutes.   POSSIBLE D/C IN 1-2DAYS, DEPENDING ON CLINICAL CONDITION.   Sereen Schaff,  Mardi MainlandDavid K M.D on 03/04/2016 at 11:17 AM  Between 7am to 6pm - Pager - [phone]  After 6pm: House Pager: - [phone]  [facility]  Office  [phone]  CC: Primary care physician; No PCP Per Patient

## 2016-03-04 NOTE — Progress Notes (Signed)
Patient meeting inpatient criteria - rhabdomyolysis - including kidney injury And CVA

## 2016-03-04 NOTE — Progress Notes (Signed)
Orders stand for IV fluids and telemetry monitoring.  Central monitoring notified of pts refusal.

## 2016-03-05 ENCOUNTER — Inpatient Hospital Stay: Payer: Medicare Other

## 2016-03-05 LAB — BASIC METABOLIC PANEL
ANION GAP: 7 (ref 5–15)
BUN: 22 mg/dL — ABNORMAL HIGH (ref 6–20)
CALCIUM: 8.3 mg/dL — AB (ref 8.9–10.3)
CHLORIDE: 106 mmol/L (ref 101–111)
CO2: 22 mmol/L (ref 22–32)
Creatinine, Ser: 0.79 mg/dL (ref 0.61–1.24)
GFR calc non Af Amer: >60 mL/min (ref >60)
Glucose, Bld: 112 mg/dL — ABNORMAL HIGH (ref 65–99)
Potassium: 3.7 mmol/L (ref 3.5–5.1)
Sodium: 135 mmol/L (ref 135–145)

## 2016-03-05 LAB — CK: Total CK: 585 U/L — ABNORMAL HIGH (ref 49–397)

## 2016-03-05 MED ORDER — SENNOSIDES-DOCUSATE SODIUM 8.6-50 MG PO TABS: 1.0000 | ORAL_TABLET | Freq: Every evening | ORAL | 0 refills | Status: DC | PRN

## 2016-03-05 MED ORDER — LORAZEPAM 2 MG/ML IJ SOLN: 1.0000 mg | Freq: Once | INTRAMUSCULAR | Status: DC

## 2016-03-05 NOTE — Discharge Summary (Addendum)
[facility] - [place] at [facility]   PATIENT NAME: Kelly [name]    MR#:  [id number]  DATE OF BIRTH:  29-Aug-1940  DATE OF ADMISSION:  03/03/2016 ADMITTING PHYSICIAN: Ramonita Lab, MD  DATE OF DISCHARGE: 03/05/16  PRIMARY CARE PHYSICIAN: No PCP Per Patient    ADMISSION DIAGNOSIS:  Cerebrovascular accident (CVA), unspecified mechanism (HCC) [I63.9]  DISCHARGE DIAGNOSIS:  Active Problems:   Rhabdomyolysis   SECONDARY DIAGNOSIS:   Past Medical History:  Diagnosis Date  . Hypertriglyceridemia     HOSPITAL COURSE:  Kelly [name]  is a 75 y.o. male admitted 03/03/2016 with chief complaint Fall . Please see H&P performed by Ramonita Lab, MD for further information. Patient presented after fall and staying on the floor for 2 days. Had rhabdomyolysis on admission - improved after IV fluids. Concern for weakness - possible stroke - MRI negative, PT evaluation meets criteria for SNF - patient and family accept   Recommend starting statin in 1-2 weeks with PCP, hold for now given recent rhabdo  DISCHARGE CONDITIONS:   stable  CONSULTS OBTAINED:  Treatment Team:  Pauletta Browns, MD  DRUG ALLERGIES:  No Known Allergies  DISCHARGE MEDICATIONS:   Current Discharge Medication List    START taking these medications   Details  senna-docusate (SENOKOT-S) 8.6-50 MG tablet Take 1 tablet by mouth at bedtime as needed for mild constipation. Qty: 30 tablet, Refills: 0      CONTINUE these medications which have NOT CHANGED   Details  aspirin 81 MG chewable tablet Chew 81 mg by mouth daily.         DISCHARGE INSTRUCTIONS:    DIET:  Regular diet  DISCHARGE CONDITION:  Stable  ACTIVITY:  Activity as tolerated and Activity as tolerated and no driving for today  OXYGEN:  Home Oxygen: No.   Oxygen Delivery: room air  DISCHARGE LOCATION:  SNF  If you experience worsening of your admission symptoms, develop shortness of breath, life threatening  emergency, suicidal or homicidal thoughts you must seek medical attention immediately by calling 911 or calling your MD immediately  if symptoms less severe.  You Must read complete instructions/literature along with all the possible adverse reactions/side effects for all the Medicines you take and that have been prescribed to you. Take any new Medicines after you have completely understood and accpet all the possible adverse reactions/side effects.   Please note  You were cared for by a hospitalist during your hospital stay. If you have any questions about your discharge medications or the care you received while you were in the hospital after you are discharged, you can call the unit and asked to speak with the hospitalist on call if the hospitalist that took care of you is not available. Once you are discharged, your primary care physician will handle any further medical issues. Please note that NO REFILLS for any discharge medications will be authorized once you are discharged, as it is imperative that you return to your primary care physician (or establish a relationship with a primary care physician if you do not have one) for your aftercare needs so that they can reassess your need for medications and monitor your lab values.    On the day of Discharge:   VITAL SIGNS:  Blood pressure (!) 164/88, pulse 73, temperature 98 F (36.7 C), resp. rate (!) 25, height 6\' 2"  (1.88 m), weight 116.1 kg (256 lb), SpO2 97 %.  I/O:   Intake/Output Summary (Last 24 hours) at 03/05/16  1038 Last data filed at 03/05/16 0040  Gross per 24 hour  Intake          3194.67 ml  Output              250 ml  Net          2944.67 ml    PHYSICAL EXAMINATION:  GENERAL:  75 y.o.-year-old patient lying in the bed with no acute distress.  EYES: Pupils equal, round, reactive to light and accommodation. No scleral icterus. Extraocular muscles intact.  HEENT: Head atraumatic, normocephalic. Oropharynx and nasopharynx  clear.  NECK:  Supple, no jugular venous distention. No thyroid enlargement, no tenderness.  LUNGS: Normal breath sounds bilaterally, no wheezing, rales,rhonchi or crepitation. No use of accessory muscles of respiration.  CARDIOVASCULAR: S1, S2 normal. No murmurs, rubs, or gallops.  ABDOMEN: Soft, non-tender, non-distended. Bowel sounds present. No organomegaly or mass.  EXTREMITIES: No pedal edema, cyanosis, or clubbing.  NEUROLOGIC: Cranial nerves II through XII are intact. Muscle strength 5/5 in all extremities. Sensation intact. Gait not checked.  PSYCHIATRIC: The patient is alert and oriented x 3.  SKIN: No obvious rash, lesion, or ulcer.   DATA REVIEW:   CBC  Recent Labs Lab 03/03/16 1247  WBC 18.4*  HGB 16.6  HCT 49.0  PLT 148*    Chemistries   Recent Labs Lab 03/03/16 1247 03/05/16 0522  NA 138 135  K 4.2 3.7  CL 107 106  CO2 21* 22  GLUCOSE 134* 112*  BUN 33* 22*  CREATININE 1.23 0.79  CALCIUM 8.6* 8.3*  AST 133*  --   ALT 53  --   ALKPHOS 63  --   BILITOT 1.8*  --     Cardiac Enzymes  Recent Labs Lab 03/04/16 0651  TROPONINI 0.14*    Microbiology Results  No results found for this or any previous visit.  RADIOLOGY:  Dg Chest 2 View  Result Date: 03/03/2016 CLINICAL DATA:  Weakness EXAM: CHEST  2 VIEW COMPARISON:  03/03/2016 FINDINGS: Mild elevation of right diaphragm unchanged. No acute consolidation. Possible tiny right-sided pleural effusion. Stable heart size. Mild central congestion. No pneumothorax. IMPRESSION: 1. Possible tiny right-sided pleural effusion. No acute consolidation. 2. Stable heart size.  Mild central vascular congestion. Electronically Signed   By: Jasmine PangKim  Fujinaga M.D.   On: 03/03/2016 20:05   Ct Head Wo Contrast  Result Date: 03/03/2016 CLINICAL DATA:  Unwitnessed fall. EXAM: CT HEAD WITHOUT CONTRAST TECHNIQUE: Contiguous axial images were obtained from the base of the skull through the vertex without intravenous contrast.  COMPARISON:  CT scan of January 19, 2015. FINDINGS: Brain: Mild diffuse cortical atrophy is noted. Mild chronic ischemic white matter disease is noted. No mass effect or midline shift is noted. Ventricular size is within normal limits. There is no evidence of mass lesion, hemorrhage or acute infarction. Vascular: Atherosclerosis of carotid siphons is noted. Skull: Normal. Negative for fracture or focal lesion. Sinuses/Orbits: No acute finding. Other: None. IMPRESSION: Mild diffuse cortical atrophy. Mild chronic ischemic white matter disease. No acute intracranial abnormality seen. Electronically Signed   By: Lupita RaiderJames  Green Jr, M.D.   On: 03/03/2016 13:26   Mr Brain Wo Contrast  Result Date: 03/05/2016 CLINICAL DATA:  Left-sided weakness and facial droop. EXAM: MRI HEAD WITHOUT CONTRAST TECHNIQUE: Multiplanar, multiecho pulse sequences of the brain and surrounding structures were obtained without intravenous contrast. COMPARISON:  Head CT 03/03/2016 and MRI 01/20/2015 FINDINGS: Brain: There is no evidence of acute infarct, intracranial hemorrhage, mass,  midline shift, or extra-axial fluid collection. There is mild generalized cerebral atrophy. Cerebral white matter T2 hyperintensities are unchanged from the prior MRI and nonspecific but compatible with mild chronic small vessel ischemic disease. Vascular: Major intracranial vascular flow voids are preserved. Skull and upper cervical spine: Unremarkable bone marrow signal. Sinuses/Orbits: Unremarkable orbits. Paranasal sinuses and mastoid air cells are clear. Other: None. IMPRESSION: 1. No acute intracranial abnormality. 2. Mild chronic small vessel ischemic disease. Electronically Signed   By: Sebastian AcheAllen  Grady M.D.   On: 03/05/2016 09:55   [place] Carotid Bilateral (at [facility] And [facility] Only)  Result Date: 03/04/2016 CLINICAL DATA:  TIA. EXAM: BILATERAL CAROTID DUPLEX ULTRASOUND TECHNIQUE: Wallace CullensGray scale imaging, color Doppler and duplex ultrasound were performed of bilateral  carotid and vertebral arteries in the neck. COMPARISON:  Carotid Doppler ultrasound - 01/20/2015 FINDINGS: Criteria: Quantification of carotid stenosis is based on velocity parameters that correlate the residual internal carotid diameter with NASCET-based stenosis levels, using the diameter of the distal internal carotid lumen as the denominator for stenosis measurement. The following velocity measurements were obtained: RIGHT ICA:  68/21 cm/sec CCA:  79/12 cm/sec SYSTOLIC ICA/CCA RATIO:  0.9 DIASTOLIC ICA/CCA RATIO:  1.8 ECA:  107 cm/sec LEFT ICA:  75/20 cm/sec CCA:  97/17 cm/sec SYSTOLIC ICA/CCA RATIO:  0.8 DIASTOLIC ICA/CCA RATIO:  1.2 ECA:  66 cm/sec RIGHT CAROTID ARTERY: There is no grayscale evidence of significant intimal wall thickening or atherosclerotic plaque affecting the interrogated portions of the right carotid system. There are no elevated peak systolic velocities within the interrogated course the right internal carotid artery to suggest a hemodynamically significant stenosis. RIGHT VERTEBRAL ARTERY:  Antegrade Flow LEFT CAROTID ARTERY: There is a minimal amount of eccentric mixed echogenic plaque involving the origin and proximal aspect the left internal carotid artery (image 47), not resulting in elevated peak systolic velocities within the interrogated course of the left internal carotid artery to suggest a hemodynamically significant stenosis. LEFT VERTEBRAL ARTERY:  Antegrade Flow IMPRESSION: 1. Minimal amount of left-sided atherosclerotic plaque, not resulting in a hemodynamically significant stenosis. 2. Unremarkable sonographic evaluation the right carotid system. Electronically Signed   By: Simonne ComeJohn  Watts M.D.   On: 03/04/2016 11:33   Dg Chest Port 1 View  Result Date: 03/03/2016 CLINICAL DATA:  Pain following fall EXAM: PORTABLE CHEST 1 VIEW COMPARISON:  Chest CT Aug 26, 2009 FINDINGS: There is no edema or consolidation. Heart size and pulmonary vascularity are normal. No adenopathy. No  pneumothorax. No bone lesions. IMPRESSION: No edema or consolidation. Electronically Signed   By: Bretta BangWilliam  Woodruff III M.D.   On: 03/03/2016 13:58   Dg Hip Unilat W Or Wo Pelvis 1 View Left  Result Date: 03/03/2016 CLINICAL DATA:  Pain following fall EXAM: DG HIP (WITH OR WITHOUT PELVIS) 2+V, LEFT COMPARISON:  None. FINDINGS: Frontal pelvis as well as frontal and lateral left hip images were obtained. There is a total hip prosthesis on the left which appears well seated. There is no acute fracture or dislocation. There is moderate narrowing of the right hip joint. No erosive change. Bones are osteoporotic. IMPRESSION: Left total hip prosthesis well-seated. No acute fracture or dislocation. Moderate right hip joint osteoarthritic change. Bones osteoporotic. Electronically Signed   By: Bretta BangWilliam  Woodruff III M.D.   On: 03/03/2016 14:00     Management plans discussed with the patient, family and they are in agreement.  CODE STATUS:     Code Status Orders        Start  Ordered   03/03/16 1751  Full code  Continuous     03/03/16 1750    Code Status History    Date Active Date Inactive Code Status Order ID Comments User Context   01/19/2015  8:53 PM 01/20/2015  3:41 PM Full Code [id number]  Auburn Bilberry, MD Inpatient    Advance Directive Documentation   Flowsheet Row Most Recent Value  Type of Advance Directive  Healthcare Power of Attorney  Pre-existing out of facility DNR order (yellow form or pink MOST form)  No data  "MOST" Form in Place?  No data      TOTAL TIME TAKING CARE OF THIS PATIENT: 33 minutes.    Deshannon Hinchliffe,  Mardi Mainland.D on 03/05/2016 at 10:38 AM  Between 7am to 6pm - Pager - [phone]  After 6pm go to [email or web address] - Scientist, research (life sciences) [place] [facility]  Office  [phone]  CC: Primary care physician; No PCP Per Patient

## 2016-03-05 NOTE — Evaluation (Signed)
Clinical/Bedside Swallow Evaluation Patient Details  Name: Consuello ClossDonald R Rijo MRN: [id number] Date of Birth: 11-10-1940  Today's Date: 03/05/2016 Time: SLP Start Time (ACUTE ONLY): 1000 SLP Stop Time (ACUTE ONLY): 1100 SLP Time Calculation (min) (ACUTE ONLY): 60 min  Past Medical History:  Past Medical History:  Diagnosis Date  . Hypertriglyceridemia    Past Surgical History:  Past Surgical History:  Procedure Laterality Date  . HAND SURGERY    . TOTAL HIP ARTHROPLASTY     Left   HPI:  Pt  is a 75 y.o. male with a known history of Coronary artery disease and hyperlipidemia, who lives alone, fell 2 days ago in the bathroom and was unable to move for several hours, finally patient was able to crawl to his living room and called his daughter who called EMS to rescue him. EMS reports the patient was covered with stool and his home was filthy. Staff has noticed left-sided weakness and left facial droop CT head is negative but troponins are elevated. MRI this AM revealed no acute abnormalities; chronic small vessel ischemic disease. Unsure of pt's full baseline Cognitive status at home as he lives alone. Currently, he is verbally conversive and able to answer basic, general questions but was unsure of the month w/out cues given, and he thought he had laid on the floor "for days" when he slid out of the bed at home "b/f the police arrived". Recommend a full Cognitive-Linguistic evaluation to determine Cogntive baseline if there is concern of any decline and/or safety concerns living alone.    Assessment / Plan / Recommendation Clinical Impression  Pt appeared to adequately tolerate trials of thin liquids and soft solids w/ no overt s/s of aspiration noted; no decline in respiratory status or vocal quality change noted. Oral phase wfl for bolus management taking his time. Pt fed self w/ min setup assist given. Pt appears at his baseline for reduced risk for aspriation following general aspiration  precautions; discussed general precautions w/ him to include small bites/sips and eating/drinking slowly. Recommend a full Cognitive-Linguistic evaluation to determine Cogntive baseline if there is concern of any decline and/or safety concerns living alone.      Aspiration Risk   (reduced)    Diet Recommendation  Mech Soft/Regular w/ thin liquids; general aspiration precautions. Monitoring as needed.  Medication Administration: Whole meds with liquid (monitor for any coughing)    Other  Recommendations Recommended Consults:  (none) Oral Care Recommendations: Oral care BID;Staff/trained caregiver to provide oral care   Follow up Recommendations None (PT/OT)      Frequency and Duration            Prognosis Prognosis for Safe Diet Advancement: Good (for swallowing)      Swallow Study   General Date of Onset: 03/03/16 HPI: Pt  is a 75 y.o. male with a known history of Coronary artery disease and hyperlipidemia, who lives alone, fell 2 days ago in the bathroom and was unable to move for several hours, finally patient was able to crawl to his living room and called his daughter who called EMS to rescue him. EMS reports the patient was covered with stool and his home was filthy. Staff has noticed left-sided weakness and left facial droop CT head is negative but troponins are elevated. MRI this AM revealed no acute abnormalities; chronic small vessel ischemic disease. Unsure of pt's full baseline Cognitive status at home as he lives alone. Currently, he is verbally conversive and able to answer basic, general  questions but was unsure of the month w/out cues given, and he thought he had laid on the floor "for days" when he slid out of the bed at home "b/f the police arrived". Recommend a full Cognitive-Linguistic evaluation to determine Cogntive baseline if there is concern of any decline and/or safety concerns living alone.  Type of Study: Bedside Swallow Evaluation Diet Prior to this Study:  Regular;Thin liquids Temperature Spikes Noted: No Respiratory Status: Room air History of Recent Intubation: No Behavior/Cognition: Alert;Cooperative;Pleasant mood;Distractible;Requires cueing Oral Cavity Assessment: Within Functional Limits Oral Care Completed by SLP: Recent completion by staff Oral Cavity - Dentition: Adequate natural dentition Vision: Functional for self-feeding Self-Feeding Abilities: Able to feed self;Needs set up (min) Patient Positioning: Upright in chair Baseline Vocal Quality: Normal Volitional Cough: Strong Volitional Swallow: Able to elicit    Oral/Motor/Sensory Function Overall Oral Motor/Sensory Function: Within functional limits (unsure if slight decreased tone in upper Left labial area)   Ice Chips Ice chips: Not tested   Thin Liquid Thin Liquid: Within functional limits Presentation: Cup;Self Fed (~3-4 ozs total)    Nectar Thick Nectar Thick Liquid: Not tested   Honey Thick Honey Thick Liquid: Not tested   Puree Puree: Not tested   Solid   GO   Solid: Within functional limits Presentation: Self Fed (several graham crackers and PB) Other Comments: fed self w/ min setup assist         Jerilynn SomKatherine Louvinia Cumbo, MS, CCC-SLP  Masyn Fullam 03/05/2016,11:57 AM

## 2016-03-05 NOTE — Clinical Social Work Placement (Signed)
   CLINICAL SOCIAL WORK PLACEMENT  NOTE  Date:  03/05/2016  Patient Details  Name: Kelly ClossDonald R Diesing MRN: [id number] Date of Birth: 11/26/1940  Clinical Social Work is seeking post-discharge placement for this patient at the Skilled  Nursing Facility level of care (*CSW will initial, date and re-position this form in  chart as items are completed):  Yes   Patient/family provided with [facility] Clinical Social Work Department's list of facilities offering this level of care within the geographic area requested by the patient (or if unable, by the patient's family).  Yes   Patient/family informed of their freedom to choose among providers that offer the needed level of care, that participate in Medicare, Medicaid or managed care program needed by the patient, have an available bed and are willing to accept the patient.  Yes   Patient/family informed of [facility]'s ownership interest in [facility] and [facility], as well as of the fact that they are under no obligation to receive care at these facilities.  PASRR submitted to EDS on 03/05/16     PASRR number received on 03/05/16     Existing PASRR number confirmed on       FL2 transmitted to all facilities in geographic area requested by pt/family on 03/05/16     FL2 transmitted to all facilities within larger geographic area on       Patient informed that his/her managed care company has contracts with or will negotiate with certain facilities, including the following:        Yes   Patient/family informed of bed offers received.  Patient chooses bed at  [facility]     Physician recommends and patient chooses bed at      Patient to be transferred to  [facility] on 03/05/16.  Patient to be transferred to facility by  (Patient's daughter Camelia Engerri will transport patient. )     Patient family notified on 03/05/16 of transfer.  Name of family member notified:   (Patient's daughter Camelia Engerri is awre of D/C today. )      PHYSICIAN       Additional Comment:    _______________________________________________ Adlai Nieblas, Darleen CrockerBailey M, LCSW 03/05/2016, 1:31 PM

## 2016-03-05 NOTE — Clinical Social Work Note (Signed)
Clinical Social Work Assessment  Patient Details  Name: Kelly [name] MRN: [id number] Date of Birth: Feb 05, 1941  Date of referral:  03/05/16               Reason for consult:  Facility Placement                Permission sought to share information with:  Chartered certified accountant granted to share information::  Yes, Verbal Permission Granted  Name::      Public affairs consultant::   [place]   Relationship::     Contact Information:     Housing/Transportation Living arrangements for the past 2 months:  [place] of Information:  Patient, Adult Children Patient Interpreter Needed:  None Criminal Activity/Legal Involvement Pertinent to Current Situation/Hospitalization:  No - Comment as needed Significant Relationships:  Adult Children, Other Family Members Lives with:  Self Do you feel safe going back to the place where you live?  Yes Need for family participation in patient care:  Yes (Comment)  Care giving concerns:  Patient lives alone in [place].    Social Worker assessment / plan:  Holiday representative (CSW) received SNF consult. PT is recommending SNF. CSW met with patient and his daughter Karna Christmas was at bedside. CSW introduced self and explained role of CSW department. Patient was alert and oriented and sitting up in the chair. Per daughter patient lives alone in [place] and she lives in [place]. Daughter is agreeable to SNF search and prefers [facility]. FL2 complete and faxed out. [facility] extended a bed offer and daughter accepted.   Patient is medically stable for D/C to [facility] today. Per [facility] admissions coordinator at [facility] patient will go to room 1202. RN will call report and patient's daughter Karna Christmas will transport patient. CSW sent D/C orders to [facility] via [place]. Please reconsult if future social work needs arise. CSW signing off.   Employment status:  Retired Nurse, adult PT  Recommendations:  [place] / Referral to community resources:  [place]  Patient/Family's Response to care:  Patient and daughter are agreeable for patient to go to [facility] today.   Patient/Family's Understanding of and Emotional Response to Diagnosis, Current Treatment, and Prognosis:  Patient and daughter were pleasant and thanked CSW for assistance.   Emotional Assessment Appearance:  Appears stated age Attitude/Demeanor/Rapport:    Affect (typically observed):  Accepting, Adaptable, Pleasant Orientation:  Oriented to Self, Oriented to Place, Oriented to  Time, Oriented to Situation Alcohol / Substance use:  Not Applicable Psych involvement (Current and /or in the community):  No (Comment)  Discharge Needs  Concerns to be addressed:  Discharge Planning Concerns Readmission within the last 30 days:  No Current discharge risk:  None Barriers to Discharge:  No Barriers Identified   Ow[place] Hara, Veronia Beets, LCSW 03/05/2016, 1:32 PM

## 2016-03-05 NOTE — Care Management Important Message (Signed)
Important Message  Patient Details  Name: Consuello ClossDonald R [place] MRN: [id number] Date of Birth: [birth year]   Medicare Important Message Given:  Yes    Gwenette GreetBrenda S Kaisen Ackers, RN 03/05/2016, 8:27 AM

## 2016-03-05 NOTE — Progress Notes (Signed)
Physical Therapy Treatment Patient Details Name: Kelly [name] MRN: [id number] DOB: [birth year] Today's Date: 03/05/2016    History of Present Illness Kelly GlassDonald [name]  is a 75 y.o. male with a known history of Coronary artery disease and hyperlipidemia, who lives alone, fell 2 days ago in the bathroom and was unable to move for several hours, finally patient was able to crawl to his living room and called his daughter who called EMS to rescue him. EMS reports the patient was covered with stool and his home was filthy. Staff has noticed left-sided weakness and left facial droop CT head is negative but troponins are elevated at 0.28 and total CK is also elevated patient is answering questions appropriately. Pt denies other falls in the last 12 months    PT Comments    Pt able to progress to ambulating short distances with RW but requires min to mod assist of therapist to perform safely.  RW adjusted to pt which improved pt's gait and decreased assist levels but pt still requiring consistent vc's to stay close to RW and to utilize RW appropriately.  MRI done this morning and negative for stroke.  Will continue to progress pt with strengthening, balance, gait mechanics, and increasing independence with functional mobility per pt tolerance.     Follow Up Recommendations  SNF     Equipment Recommendations  Rolling walker with 5" wheels    Recommendations for Other Services       Precautions / Restrictions Precautions Precautions: Fall Restrictions Weight Bearing Restrictions: No    Mobility  Bed Mobility Overal bed mobility: Needs Assistance Bed Mobility: Supine to Sit     Supine to sit: Max assist;HOB elevated     General bed mobility comments: assist for trunk supine to sit; increased effort and time to scoot to edge of bed; vc's for technique required  Transfers Overall transfer level: Needs assistance Equipment used: Rolling walker (2 wheeled) Transfers: Sit to/from  Stand Sit to Stand: Mod assist;Max assist         General transfer comment: vc's for hand and feet placement; increased effort and assist to initiate stand from bed and from toilet  Ambulation/Gait Ambulation/Gait assistance: Min assist;Mod assist Ambulation Distance (Feet):  (20 feet x2 ) Assistive device: Rolling walker (2 wheeled)   Gait velocity: impulsive but mildly decreased   General Gait Details: decreased stance time L LE; unsteady; RW height adjusted to improve fit; vc's to stay closer to [employer]   Stairs            Wheelchair Mobility    Modified Rankin (Stroke Patients Only)       Balance Overall balance assessment: Needs assistance Sitting-balance support: Bilateral upper extremity supported;Feet supported Sitting balance-Leahy Scale: Fair     Standing balance support: Bilateral upper extremity supported Standing balance-Leahy Scale: Fair Standing balance comment: static standing with assist to doff/donn depends                    Cognition Arousal/Alertness: Awake/alert Behavior During Therapy: Impulsive Overall Cognitive Status: Within Functional Limits for tasks assessed                      Exercises      General Comments   Nursing cleared pt for participation in physical therapy and nursing reported that pt was not on bedrest and that she would discontinue bed rest orders.  Pt agreeable to PT session.       Pertinent Vitals/Pain  Pain Assessment: No/denies pain  Vitals (HR and O2) stable and WFL throughout treatment session.    Home Living                      Prior Function            PT Goals (current goals can now be found in the care plan section) Acute Rehab PT Goals Patient Stated Goal: Return to prior level of function at home PT Goal Formulation: With patient/family Time For Goal Achievement: 03/18/16 Potential to Achieve Goals: Good Progress towards PT goals: Progressing toward goals     Frequency    Min 2X/week      PT Plan Frequency needs to be updated (MRI negative for stroke)    Co-evaluation             End of Session Equipment Utilized During Treatment: Gait belt Activity Tolerance: Patient tolerated treatment well Patient left: in chair;with call bell/phone within reach;with chair alarm set     Time: [id number] PT Time Calculation (min) (ACUTE ONLY): 23 min  Charges:  $Gait Training: 8-22 mins $Therapeutic Activity: 8-22 mins                    G CodesHendricks Limes:      Ahlani Wickes 03/05/2016, 10:02 AM Hendricks LimesEmily Raul Winterhalter, PT [phone]

## 2016-03-05 NOTE — Progress Notes (Signed)
Pt is being discharged to [facility]. Report given to [place], LPN. Discharge packet given to pt's daughter. Discharge papers given and explained to pt and pt's daughter. Both verbalized understanding. Pt's daughter to transport pt to facility.

## 2016-03-05 NOTE — [place] FL2 (Signed)
  [place] MEDICAID FL2 LEVEL OF CARE SCREENING TOOL     IDENTIFICATION  Patient Name: Consuello ClossDonald R Cadet Birthdate: 08/15/1940 Sex: male Admission Date (Current Location): 03/03/2016  [place]   Facility and Address:  [facility], [address], [place], [place] [address]      Provider Number: [id number]  Attending Physician Name and Address:  Wyatt Hasteavid K Soumya Colson, MD  Relative Name and Phone Number:       Current Level of Care: Hospital Recommended Level of Care: Skilled Nursing Facility Prior Approval Number:    Date Approved/Denied:   PASRR Number:  [id number])  Discharge Plan: SNF    Current Diagnoses: Patient Active Problem List   Diagnosis Date Noted  . 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 CAD (coronary artery disease) 10/26/2010  . Hyperlipidemia 10/26/2010  . SHORTNESS OF BREATH 07/18/2009    Orientation RESPIRATION BLADDER Height & Weight     Self, Time, Situation, Place  Normal Continent Weight: 256 lb (116.1 kg) Height:  6\' 2"  (188 cm)  BEHAVIORAL SYMPTOMS/MOOD NEUROLOGICAL BOWEL NUTRITION STATUS   (none)  (none) Continent Diet (Diet: Heart Healthy )  AMBULATORY STATUS COMMUNICATION OF NEEDS Skin   Extensive Assist Verbally Normal                       Personal Care Assistance Level of Assistance  Bathing, Feeding, Dressing Bathing Assistance: Limited assistance Feeding assistance: Independent Dressing Assistance: Limited assistance     Functional Limitations Info  Sight, Hearing, Speech Sight Info: Adequate Hearing Info: Adequate Speech Info: Adequate    SPECIAL CARE FACTORS FREQUENCY  PT (By licensed PT), OT (By licensed OT)     PT Frequency:  (5) OT Frequency:  (5)            Contractures      Additional Factors Info  Code Status, Allergies Code Status Info:  (Full Code. ) Allergies Info:  (No Known Allergies. )           Current Medications (03/05/2016):  This is the  current hospital active medication list Current Facility-Administered Medications  Medication Dose Route Frequency Provider Last Rate Last Dose  . 0.9 %  sodium chloride infusion   Intravenous Continuous Wyatt Hasteavid K Lempi Edwin, MD   Stopped at 03/05/16 0900  . acetaminophen (TYLENOL) tablet 650 mg  650 mg Oral Q4H PRN Ramonita LabAruna Gouru, MD       Or  . acetaminophen (TYLENOL) suppository 650 mg  650 mg Rectal Q4H PRN Ramonita LabAruna Gouru, MD      . aspirin tablet 325 mg  325 mg Oral Daily Ramonita LabAruna Gouru, MD   325 mg at 03/05/16 1048  . enoxaparin (LOVENOX) injection 40 mg  40 mg Subcutaneous Q24H Ramonita LabAruna Gouru, MD   40 mg at 03/04/16 2130  . haloperidol lactate (HALDOL) injection 1 mg  1 mg Intravenous Q6H PRN Shaune PollackQing Chen, MD   1 mg at 03/04/16 2130  . LORazepam (ATIVAN) injection 1 mg  1 mg Intravenous Once Wyatt Hasteavid K Christopher Glasscock, MD      . senna-docusate (Senokot-S) tablet 1 tablet  1 tablet Oral QHS PRN Ramonita LabAruna Gouru, MD         Discharge Medications: Please see discharge summary for a list of discharge medications.  Relevant Imaging Results:  Relevant Lab Results:   Additional Information  (SSN: [id number])  Sample, Darleen CrockerBailey M, LCSW

## 2016-03-08 ENCOUNTER — Non-Acute Institutional Stay (SKILLED_NURSING_FACILITY): Payer: Medicare Other | Admitting: Internal Medicine

## 2016-03-08 ENCOUNTER — Encounter: Payer: Self-pay | Admitting: Internal Medicine

## 2016-03-08 DIAGNOSIS — R030 Elevated blood-pressure reading, without diagnosis of hypertension: Secondary | ICD-10-CM | POA: Diagnosis not present

## 2016-03-08 DIAGNOSIS — D72828 Other elevated white blood cell count: Secondary | ICD-10-CM | POA: Diagnosis not present

## 2016-03-08 DIAGNOSIS — E7849 Other hyperlipidemia: Secondary | ICD-10-CM

## 2016-03-08 DIAGNOSIS — R29898 Other symptoms and signs involving the musculoskeletal system: Secondary | ICD-10-CM | POA: Diagnosis not present

## 2016-03-08 DIAGNOSIS — I2510 Atherosclerotic heart disease of native coronary artery without angina pectoris: Secondary | ICD-10-CM | POA: Diagnosis not present

## 2016-03-08 DIAGNOSIS — R748 Abnormal levels of other serum enzymes: Secondary | ICD-10-CM

## 2016-03-08 DIAGNOSIS — T796XXS Traumatic ischemia of muscle, sequela: Secondary | ICD-10-CM | POA: Diagnosis not present

## 2016-03-08 DIAGNOSIS — R5381 Other malaise: Secondary | ICD-10-CM

## 2016-03-08 DIAGNOSIS — E784 Other hyperlipidemia: Secondary | ICD-10-CM

## 2016-03-08 DIAGNOSIS — D638 Anemia in other chronic diseases classified elsewhere: Secondary | ICD-10-CM | POA: Diagnosis not present

## 2016-03-08 DIAGNOSIS — K5909 Other constipation: Secondary | ICD-10-CM | POA: Diagnosis not present

## 2016-03-08 NOTE — Progress Notes (Signed)
[facility]  PCP: No PCP Per Patient   Code Status: Full Code  Goals of care: Advanced Directive information Advanced Directives 03/03/2016  Does Patient Have a Medical Advance Directive? Yes  Type of Advance Directive Healthcare Power of Attorney  Does patient want to make changes to medical advance directive? No - Patient declined  Copy of Healthcare Power of Attorney in Chart? No - copy requested  Would patient like information on creating a medical advance directive? -       Extended Emergency Contact Information Primary Emergency Contact: Bonham,Terri Address: [address]          [place], [place] [address] [place] Home Phone: [phone] Relation: Daughter   No Known Allergies  Chief Complaint  Patient presents with  . New Admit To SNF    New Admission Visit     HPI:  Patient is a 75 y.o. male seen today for short term rehabilitation post hospital admission from 03/03/16-03/05/16 post fall with possible acute CVA and rhabomyolysis. He had left facial droop and LLE weakness. He also had acute kidney injury.  He received iv fluids. He was seen by neurology and placed on aspirin. MRI brain was negative He has medical history of CAD, HLD. He is seen in his room today.   Review of Systems:  Constitutional: Negative for fever, chills, diaphoresis. Feels weak and tired. HENT: Negative for headache, congestion, nasal discharge, difficulty swallowing.   Eyes: Negative for double vision and discharge.  Respiratory: Negative for cough, shortness of breath and wheezing.   Cardiovascular: Negative for chest pain, palpitations, leg swelling.  Gastrointestinal: Negative for heartburn, nausea, vomiting, abdominal pain. Last bowel movement was today. Genitourinary: Negative for dysuria and flank pain.  Musculoskeletal: Negative for back pain, fall in the facility.  Skin: Negative for itching, rash.  Neurological: Negative for  dizziness. Psychiatric/Behavioral: Negative for depression   Past Medical History:  Diagnosis Date  . Hypertriglyceridemia    Past Surgical History:  Procedure Laterality Date  . HAND SURGERY    . TOTAL HIP ARTHROPLASTY     Left   Social History:   reports that he has never smoked. He has never used smokeless tobacco. He reports that he does not drink alcohol or use drugs.  Family History  Problem Relation Age of Onset  . Heart failure Father   . Hypertension Father     Medications:   Medication List       Accurate as of 03/08/16 12:14 PM. Always use your most recent med list.          aspirin 81 MG chewable tablet Chew 81 mg by mouth daily.   sennosides-docusate sodium 8.6-50 MG tablet Commonly known as:  SENOKOT-S Take 1 tablet by mouth at bedtime.       Immunizations:  There is no immunization history on file for this patient.   Physical Exam: Vitals:   03/08/16 1209  BP: (!) 162/86  Pulse: 88  Resp: 20  Temp: 97.8 F (36.6 C)  TempSrc: Oral  SpO2: 95%  Weight: 257 lb (116.6 kg)  Height: 6\' 2"  (1.88 m)   Body mass index is 33 kg/m.  General- elderly male, obese, in no acute distress Head- normocephalic, atraumatic Nose- no maxillary or frontal sinus tenderness, no nasal discharge Throat- moist mucus membrane  Eyes- PERRLA, EOMI, no pallor, no icterus, no discharge, normal conjunctiva, normal sclera Neck- no cervical lymphadenopathy Cardiovascular- normal s1,s2, no murmur, no leg edema Respiratory-  bilateral clear to auscultation, no wheeze, no rhonchi, no crackles, no use of accessory muscles Abdomen- bowel sounds present, soft, non tender Musculoskeletal- able to move all 4 extremities, generalized weakness prominent to LLE Neurological- alert and oriented to person, place and time Skin- warm and dry Psychiatry- normal mood and affect    Labs reviewed: Basic Metabolic Panel:  Recent Labs  16/10/96 1247 03/05/16 0522  NA 138  135  K 4.2 3.7  CL 107 106  CO2 21* 22  GLUCOSE 134* 112*  BUN 33* 22*  CREATININE 1.23 0.79  CALCIUM 8.6* 8.3*   Liver Function Tests:  Recent Labs  03/03/16 1247  AST 133*  ALT 53  ALKPHOS 63  BILITOT 1.8*  PROT 7.2  ALBUMIN 3.7   No results for input(s): LIPASE, AMYLASE in the last 8760 hours. No results for input(s): AMMONIA in the last 8760 hours. CBC:  Recent Labs  03/03/16 1247  WBC 18.4*  NEUTROABS 14.7*  HGB 16.6  HCT 49.0  MCV 94.4  PLT 148*   Cardiac Enzymes:  Recent Labs  03/03/16 1247 03/03/16 1755 03/04/16 0047 03/04/16 0651 03/05/16 0522  CKTOTAL 2,954*  --   --  1,050* 585*  TROPONINI 0.28* 0.15* 0.16* 0.14*  --    BNP: Invalid input(s): POCBNP CBG: No results for input(s): GLUCAP in the last 8760 hours.  Radiological Exams: Dg Chest 2 View  Result Date: 03/03/2016 CLINICAL DATA:  Weakness EXAM: CHEST  2 VIEW COMPARISON:  03/03/2016 FINDINGS: Mild elevation of right diaphragm unchanged. No acute consolidation. Possible tiny right-sided pleural effusion. Stable heart size. Mild central congestion. No pneumothorax. IMPRESSION: 1. Possible tiny right-sided pleural effusion. No acute consolidation. 2. Stable heart size.  Mild central vascular congestion. Electronically Signed   By: Jasmine Pang M.D.   On: 03/03/2016 20:05   Ct Head Wo Contrast  Result Date: 03/03/2016 CLINICAL DATA:  Unwitnessed fall. EXAM: CT HEAD WITHOUT CONTRAST TECHNIQUE: Contiguous axial images were obtained from the base of the skull through the vertex without intravenous contrast. COMPARISON:  CT scan of January 19, 2015. FINDINGS: Brain: Mild diffuse cortical atrophy is noted. Mild chronic ischemic white matter disease is noted. No mass effect or midline shift is noted. Ventricular size is within normal limits. There is no evidence of mass lesion, hemorrhage or acute infarction. Vascular: Atherosclerosis of carotid siphons is noted. Skull: Normal. Negative for fracture  or focal lesion. Sinuses/Orbits: No acute finding. Other: None. IMPRESSION: Mild diffuse cortical atrophy. Mild chronic ischemic white matter disease. No acute intracranial abnormality seen. Electronically Signed   By: Lupita Raider, M.D.   On: 03/03/2016 13:26   Mr Brain Wo Contrast  Result Date: 03/05/2016 CLINICAL DATA:  Left-sided weakness and facial droop. EXAM: MRI HEAD WITHOUT CONTRAST TECHNIQUE: Multiplanar, multiecho pulse sequences of the brain and surrounding structures were obtained without intravenous contrast. COMPARISON:  Head CT 03/03/2016 and MRI 01/20/2015 FINDINGS: Brain: There is no evidence of acute infarct, intracranial hemorrhage, mass, midline shift, or extra-axial fluid collection. There is mild generalized cerebral atrophy. Cerebral white matter T2 hyperintensities are unchanged from the prior MRI and nonspecific but compatible with mild chronic small vessel ischemic disease. Vascular: Major intracranial vascular flow voids are preserved. Skull and upper cervical spine: Unremarkable bone marrow signal. Sinuses/Orbits: Unremarkable orbits. Paranasal sinuses and mastoid air cells are clear. Other: None. IMPRESSION: 1. No acute intracranial abnormality. 2. Mild chronic small vessel ischemic disease. Electronically Signed   By: Sebastian Ache M.D.   On:  03/05/2016 09:55   US Carotid Bilateral (at [facility] Only)  Result Date: 03/04/2016 CLINICAL DATA:  TIA. EXAM: BILATERAL CAROTID DUPLEX ULTRASOUND TECHNIQUE: Wallace Cullens scale imaging, color Doppler and duplex ultrasound were performed of bilateral carotid and vertebral arteries in the neck. COMPARISON:  Carotid Doppler ultrasound - 01/20/2015 FINDINGS: Criteria: Quantification of carotid stenosis is based on velocity parameters that correlate the residual internal carotid diameter with NASCET-based stenosis levels, using the diameter of the distal internal carotid lumen as the denominator for stenosis measurement. The following velocity  measurements were obtained: RIGHT ICA:  68/21 cm/sec CCA:  79/12 cm/sec SYSTOLIC ICA/CCA RATIO:  0.9 DIASTOLIC ICA/CCA RATIO:  1.8 ECA:  107 cm/sec LEFT ICA:  75/20 cm/sec CCA:  97/17 cm/sec SYSTOLIC ICA/CCA RATIO:  0.8 DIASTOLIC ICA/CCA RATIO:  1.2 ECA:  66 cm/sec RIGHT CAROTID ARTERY: There is no grayscale evidence of significant intimal wall thickening or atherosclerotic plaque affecting the interrogated portions of the right carotid system. There are no elevated peak systolic velocities within the interrogated course the right internal carotid artery to suggest a hemodynamically significant stenosis. RIGHT VERTEBRAL ARTERY:  Antegrade Flow LEFT CAROTID ARTERY: There is a minimal amount of eccentric mixed echogenic plaque involving the origin and proximal aspect the left internal carotid artery (image 47), not resulting in elevated peak systolic velocities within the interrogated course of the left internal carotid artery to suggest a hemodynamically significant stenosis. LEFT VERTEBRAL ARTERY:  Antegrade Flow IMPRESSION: 1. Minimal amount of left-sided atherosclerotic plaque, not resulting in a hemodynamically significant stenosis. 2. Unremarkable sonographic evaluation the right carotid system. Electronically Signed   By: Simonne Come M.D.   On: 03/04/2016 11:33   Dg Chest Port 1 View  Result Date: 03/03/2016 CLINICAL DATA:  Pain following fall EXAM: PORTABLE CHEST 1 VIEW COMPARISON:  Chest CT Aug 26, 2009 FINDINGS: There is no edema or consolidation. Heart size and pulmonary vascularity are normal. No adenopathy. No pneumothorax. No bone lesions. IMPRESSION: No edema or consolidation. Electronically Signed   By: Bretta Bang III M.D.   On: 03/03/2016 13:58   Dg Hip Unilat W Or Wo Pelvis 1 View Left  Result Date: 03/03/2016 CLINICAL DATA:  Pain following fall EXAM: DG HIP (WITH OR WITHOUT PELVIS) 2+V, LEFT COMPARISON:  None. FINDINGS: Frontal pelvis as well as frontal and lateral left hip images  were obtained. There is a total hip prosthesis on the left which appears well seated. There is no acute fracture or dislocation. There is moderate narrowing of the right hip joint. No erosive change. Bones are osteoporotic. IMPRESSION: Left total hip prosthesis well-seated. No acute fracture or dislocation. Moderate right hip joint osteoarthritic change. Bones osteoporotic. Electronically Signed   By: Bretta Bang III M.D.   On: 03/03/2016 14:00    Assessment/Plan  Physical deconditioning Will have patient work with PT/OT as tolerated to regain strength and restore function.  Fall precautions are in place.  LLE weakness Post fall with rhabdomyolysis. Xray has ruled out fracture and acute dislocation. Will have him work with physical therapy and occupational therapy team to help with gait training and muscle strengthening exercises.fall precautions. Skin care. Encourage to be out of bed.   Leukocytosis Afebrile, likely a reactive response, monitor wbc curve  Anemia of chronic disease Monitor cbc  Rhabdomyolysis S/p iv fluids. Check renal function. Fall precautions. Check ck level.   Chronic constipation Continue senokot s qhs and monitor  CAD Chest pain free. Continue baby aspirin. Statin on hold with recent  rhabdomyolysis  HLD Was taking statin at home, on hold at present with rhabdomyolysis. Check ck in 1 week and resume statin if normal  Elevated BP No know history of HTN. Check BP reading and introduce antihypertensive if indicated  Deranged LFT Check LFT level    Goals of care: short term rehabilitation   Labs/tests ordered: cbc, bmp, ck 03/11/16  Family/ staff Communication: reviewed care plan with patient and nursing supervisor    Oneal GroutMAHIMA Evaleigh Mccamy, MD Internal Medicine [facility] [address] [place], [place] [address] Cell Phone (Monday-Friday 8 am - 5 pm): [phone] On Call: [phone] and follow prompts after 5 pm  and on weekends Office Phone: [phone] Office Fax: [phone]

## 2016-03-11 LAB — BASIC METABOLIC PANEL
BUN: 14 mg/dL (ref 4–21)
CREATININE: 0.9 mg/dL (ref 0.6–1.3)
GLUCOSE: 122 mg/dL
Potassium: 4.5 mmol/L (ref 3.4–5.3)
SODIUM: 135 mmol/L — AB (ref 137–147)

## 2016-03-11 LAB — HEPATIC FUNCTION PANEL
ALT: 42 U/L — AB (ref 10–40)
AST: 25 U/L (ref 14–40)
Alkaline Phosphatase: 107 U/L (ref 25–125)
BILIRUBIN, TOTAL: 0.7 mg/dL

## 2016-03-11 LAB — CBC AND DIFFERENTIAL
HEMATOCRIT: 46 % (ref 41–53)
HEMOGLOBIN: 16.2 g/dL (ref 13.5–17.5)
Platelets: 274 10*3/uL (ref 150–399)
WBC: 10 10*3/mL

## 2016-03-24 ENCOUNTER — Encounter: Payer: Self-pay | Admitting: Family

## 2016-03-24 ENCOUNTER — Non-Acute Institutional Stay (SKILLED_NURSING_FACILITY): Payer: Medicare Other | Admitting: Family

## 2016-03-24 DIAGNOSIS — R531 Weakness: Secondary | ICD-10-CM | POA: Diagnosis not present

## 2016-03-24 DIAGNOSIS — E782 Mixed hyperlipidemia: Secondary | ICD-10-CM | POA: Diagnosis not present

## 2016-03-24 DIAGNOSIS — I2510 Atherosclerotic heart disease of native coronary artery without angina pectoris: Secondary | ICD-10-CM | POA: Diagnosis not present

## 2016-03-24 DIAGNOSIS — R2681 Unsteadiness on feet: Secondary | ICD-10-CM

## 2016-03-24 NOTE — Progress Notes (Signed)
Patient ID: Kelly [name], male   DOB: 1940-08-07, 75 y.o.   MRN: [id number]  Location:  [facility] Room Number: 1202 Place of Service:  SNF (31)  Provider: Richarda Blade FNP-C   PCP: No PCP Per Patient Patient Care Team: No Pcp Per Patient as PCP - General (General Practice)  Extended Emergency Contact Information Primary Emergency Contact: Bonham,Terri Address: [address]          [place], [place] [address] [place] Home Phone: [phone] Relation: Daughter  Code Status: Full Code  Goals of care:  Advanced Directive information Advanced Directives 03/24/2016  Does Patient Have a Medical Advance Directive? Yes  Type of Advance Directive Healthcare Power of Attorney  Does patient want to make changes to medical advance directive? -  Copy of Healthcare Power of Attorney in Chart? Yes  Would patient like information on creating a medical advance directive? -     No Known Allergies  Chief Complaint  Patient presents with  . Discharge Note    HPI:  75 y.o. male seen today at [facility] for discharge home. He was here for short Term Rehabilitation post Hospital admission from 03/03/2016- 03/05/2016 post Fall and staying for two days on the floor at home. He had Rhabdomyolysis which improved with IVF. MRI was negative for stroke. He has a medical history of CAD, Hyperlipidemia among others. He is seen in his room today. He denies any acute issues. He has worked well with PT/OT now stable for discharge home. He will be discharge with [facility] PT/OT to continue with ROM, Exercise, gait stability and muscle strengthening. He will require DME FWW to enable him to maintain current level of independence. He will discharge with Medications from the facility. Home Health services will be arranged by facility Social worker prior to discharge home.     Past Medical History:  Diagnosis Date  . Hypertriglyceridemia      Past Surgical History:  Procedure Laterality Date  . HAND SURGERY    . TOTAL HIP ARTHROPLASTY     Left      reports that he has never smoked. He has never used smokeless tobacco. He reports that he does not drink alcohol or use drugs. Social History   Social History  . Marital status: Single    Spouse name: N/A  . Number of children: N/A  . Years of education: N/A   Occupational History  . Full time    Social History Main Topics  . Smoking status: Never Smoker  . Smokeless tobacco: Never Used  . Alcohol use No  . Drug use: No  . Sexual activity: Not on file   Other Topics Concern  . Not on file   Social History Narrative   Divorced   Gets regular exercise      No Known Allergies  Pertinent  Health Maintenance Due  Topic Date Due  . PNA vac Low Risk Adult (1 of 2 - PCV13) 05/31/2003  . INFLUENZA VACCINE  11/11/2015    Medications:   Medication List       Accurate as of 03/24/16  4:25 PM. Always use your most recent med list.          aspirin 81 MG chewable tablet Chew 81 mg by mouth daily.   sennosides-docusate sodium 8.6-50 MG tablet Commonly known as:  SENOKOT-S Take 1 tablet by mouth at bedtime.       Review of Systems  Constitutional: Negative for activity change, appetite change, chills, fatigue and fever.  HENT: Negative for congestion, rhinorrhea, sinus pressure, sneezing, sore throat and trouble swallowing.   Eyes: Negative.   Respiratory: Negative for cough, chest tightness, shortness of breath and wheezing.   Gastrointestinal: Negative for abdominal distention, abdominal pain, constipation, diarrhea, nausea and vomiting.  Endocrine: Negative.   Genitourinary: Negative for dysuria, flank pain, frequency and urgency.  Musculoskeletal: Positive for gait problem.  Skin: Negative for color change, pallor and rash.  Neurological: Negative for dizziness, seizures, syncope, light-headedness and headaches.  Hematological: Does not  bruise/bleed easily.  Psychiatric/Behavioral: Negative for agitation, confusion, hallucinations and sleep disturbance. The patient is not nervous/anxious.     Vitals:   03/24/16 1500  BP: 129/77  Pulse: 77  Resp: 20  Temp: 97.5 F (36.4 C)  TempSrc: Oral  SpO2: 96%  Weight: 251 lb (113.9 kg)  Height: 6\' 2"  (1.88 m)   Body mass index is 32.23 kg/m. Physical Exam  Constitutional: He is oriented to person, place, and time. He appears well-developed and well-nourished. No distress.  HENT:  Head: Normocephalic.  Mouth/Throat: Oropharynx is clear and moist. No oropharyngeal exudate.  Eyes: Conjunctivae and EOM are normal. Pupils are equal, round, and reactive to light. Right eye exhibits no discharge. Left eye exhibits no discharge. No scleral icterus.  Neck: Normal range of motion. No JVD present. No thyromegaly present.  Cardiovascular: Normal rate, regular rhythm and intact distal pulses.  Exam reveals no gallop and no friction rub.   No murmur heard. Pulmonary/Chest: Effort normal and breath sounds normal. No respiratory distress. He has no wheezes. He has no rales.  Abdominal: Soft. Bowel sounds are normal. He exhibits no distension. There is no tenderness. There is no rebound and no guarding.  Musculoskeletal: Normal range of motion. He exhibits no edema, tenderness or deformity.  Unsteady gait. Generalized weakness.    Lymphadenopathy:    He has no cervical adenopathy.  Neurological: He is oriented to person, place, and time.  Skin: Skin is warm and dry. No rash noted. No erythema. No pallor.  Psychiatric: He has a normal mood and affect.    Labs reviewed: Basic Metabolic Panel:  Recent Labs  40/98/1111/22/17 1247 03/05/16 0522 03/11/16 1141  NA 138 135 135*  K 4.2 3.7 4.5  CL 107 106  --   CO2 21* 22  --   GLUCOSE 134* 112*  --   BUN 33* 22* 14  CREATININE 1.23 0.79 0.9  CALCIUM 8.6* 8.3*  --    Liver Function Tests:  Recent Labs  03/03/16 1247 03/11/16 1141    AST 133* 25  ALT 53 42*  ALKPHOS 63 107  BILITOT 1.8*  --   PROT 7.2  --   ALBUMIN 3.7  --    CBC:  Recent Labs  03/03/16 1247 03/11/16 1141  WBC 18.4* 10.0  NEUTROABS 14.7*  --   HGB 16.6 16.2  HCT 49.0 46  MCV 94.4  --   PLT 148* 274   Cardiac Enzymes:  Recent Labs  03/03/16 1247 03/03/16 1755 03/04/16 0047 03/04/16 0651 03/05/16 0522  CKTOTAL 2,954*  --   --  1,050* 585*  TROPONINI 0.28* 0.15* 0.16* 0.14*  --    Assessment/Plan:   Unsteady Gait  Has worked well with PT/OT [facility] to continue with ROM, Exercise, gait stability and muscle strengthening. He will require DME FWW to enable him to maintain current level of independence.Fall  and safety precautions.  CAD Chest pain free. Continue on ASA   Generalized weakness  Has improved with PT/OT [facility]. CBC, BMP in 1-2 weeks with PCP   Hyperlipidemia  Status post short Term Rehabilitation post Hospital admission from 03/03/2016- 03/05/2016 post Fall and staying for two days on the floor at home. He had Rhabdomyolysis which improved with IVF. Statin held to be restarted in 1-2 weeks with PCP per hospital discharge summary. MRI was negative for stroke.Lipid panel with PCP.  Patient is being discharged with the following home health services:    - PT/OT to continue with ROM, Exercise, gait stability and muscle strengthening.  Patient is being discharged with the following durable medical equipment:    -FWW to enable him to maintain current level of independence.  Patient has been advised to f/u with their PCP in 1-2 weeks to for a transitions of care visit.  Social services at their facility was responsible for arranging this appointment.  Pt was provided with adequate prescriptions of noncontrolled medications to reach the scheduled appointment .  For controlled substances, a limited supply was provided as appropriate for the individual  patient.  If the pt normally receives these medications from a pain clinic or has a contract with another physician, these medications should be received from that clinic or physician only).    Future labs/tests needed: CBC, BMP in 1-2 weeks with PCP

## 2016-04-10 DIAGNOSIS — R262 Difficulty in walking, not elsewhere classified: Secondary | ICD-10-CM | POA: Diagnosis not present

## 2016-04-10 DIAGNOSIS — M6282 Rhabdomyolysis: Secondary | ICD-10-CM | POA: Diagnosis not present

## 2016-04-10 DIAGNOSIS — R279 Unspecified lack of coordination: Secondary | ICD-10-CM | POA: Diagnosis not present

## 2016-04-10 DIAGNOSIS — R2681 Unsteadiness on feet: Secondary | ICD-10-CM | POA: Diagnosis not present

## 2016-04-10 DIAGNOSIS — M6281 Muscle weakness (generalized): Secondary | ICD-10-CM | POA: Diagnosis not present

## 2016-06-08 ENCOUNTER — Telehealth: Payer: Self-pay | Admitting: Cardiology

## 2016-06-08 NOTE — Telephone Encounter (Signed)
[facility] with Dr. Duke Salviaandolph on 06/16/2016.  [facility]  cbr

## 2016-06-08 NOTE — Telephone Encounter (Signed)
Appointment is with [facility] instead of Dr. Duke Salviaandolph

## 2016-06-09 ENCOUNTER — Ambulatory Visit: Payer: No Typology Code available for payment source | Admitting: Cardiovascular Disease

## 2016-06-10 ENCOUNTER — Encounter: Payer: Self-pay | Admitting: Cardiology

## 2016-06-13 NOTE — Progress Notes (Signed)
Cardiology Office Note    Date:  06/16/2016   ID:  Kelly [name], DOB 25-May-1940, MRN [id number]  PCP:  Lauro Regulus., MD  Cardiologist:  Peter Swaziland, MD    History of Present Illness:  Kelly [name] is a 76 y.o. male referred by Dr. Dareen Piano for evaluation of CAD. He has a history of CAD based on coronary calcification noted on CT in 2011. Previously seen by Dr. Mariah Milling in 2012. Was considered for stress testing but patient deferred. He was admitted in November with apparent CVA. Both MRI and CT negative for acute findings. He was discharged to a Rehab facility and later transferred to assisted living where he still receives PT. He is seen with his daughter today. She notes that he seems to get winded easily with activity but he adamantly denies any chest pain, SOB, or palpitations. No edema or orthopnea. Is able to get around pretty well with walker now. He denies history of HTN. He is on statin for dyslipidemia.    Past Medical History:  Diagnosis Date  . Hypertriglyceridemia     Past Surgical History:  Procedure Laterality Date  . HAND SURGERY    . TOTAL HIP ARTHROPLASTY     Left    Current Medications: Outpatient Medications Prior to Visit  Medication Sig Dispense Refill  . aspirin 81 MG chewable tablet Chew 81 mg by mouth daily.    . sennosides-docusate sodium (SENOKOT-S) 8.6-50 MG tablet Take 1 tablet by mouth at bedtime.     No facility-administered medications prior to visit.      Allergies:   Patient has no known allergies.   Social History   Social History  . Marital status: Single    Spouse name: N/A  . Number of children: 1  . Years of education: N/A   Occupational History  . ran a music store    Social History Main Topics  . Smoking status: Never Smoker  . Smokeless tobacco: Never Used  . Alcohol use No  . Drug use: No  . Sexual activity: Not Asked   Other Topics Concern  . None   Social History Narrative   Divorced   Gets regular  exercise     Family History:  The patient's family history includes Heart failure in his father; Hypertension in his father.   ROS:   Please see the history of present illness.    ROS All other systems reviewed and are negative.   PHYSICAL EXAM:   VS:  BP 136/80   Pulse 99   Ht 6\' 2"  (1.88 m)   Wt 260 lb 9.6 oz (118.2 kg)   BMI 33.46 kg/m    GEN: Well nourished, well developed, in no acute distress  HEENT: normal  Neck: no JVD, carotid bruits, or masses Cardiac: RRR; no murmurs, rubs, or gallops,no edema  Respiratory:  clear to auscultation bilaterally, normal work of breathing GI: soft, nontender, nondistended, + BS MS: no deformity or atrophy  Skin: warm and dry, no rash Neuro:  Alert and Oriented x 3, Strength and sensation are intact. ? Some dysarthria. Walks with a walker.  Psych: euthymic mood, full affect  Wt Readings from Last 3 Encounters:  06/16/16 260 lb 9.6 oz (118.2 kg)  03/24/16 251 lb (113.9 kg)  03/08/16 257 lb (116.6 kg)      Studies/Labs Reviewed:   EKG:  EKG is not  ordered today.  The ekg ordered today demonstrates N/A. Ecg from primary care showed NSR  with no acute changes.   Recent Labs: 03/11/2016: ALT 42; BUN 14; Creatinine 0.9; Hemoglobin 16.2; Platelets 274; Potassium 4.5; Sodium 135   Lipid Panel    Component Value Date/Time   CHOL 148 03/04/2016 0651   TRIG 129 03/04/2016 0651   HDL 28 (L) 03/04/2016 0651   CHOLHDL 5.3 03/04/2016 0651   VLDL 26 03/04/2016 0651   LDLCALC 94 03/04/2016 0651    Additional studies/ records that were reviewed today include:  Echo 03/03/16: Study Conclusions  - Left ventricle: The cavity size was normal. Systolic function was   normal. The estimated ejection fraction was in the range of 60%   to 65%. Wall motion was normal; there were no regional wall   motion abnormalities. Doppler parameters are consistent with   abnormal left ventricular relaxation (grade 1 diastolic   dysfunction). - Left  atrium: The atrium was normal in size. - Right ventricle: Systolic function was normal. - Pulmonary arteries: Systolic pressure was mildly elevated. PA   peak pressure: 37 mm Hg (S).  Impressions:  - Normal study. No cardiac source of emboli was indentified. No   saline contrast bubble study performed.  Carotid doppler 01/20/15: IMPRESSION: 1. Mild bilateral carotid atherosclerotic vascular disease, no significant atherosclerotic vascular plaque. No flow limiting stenosis. Degree of stenosis less than 50%.  2. Vertebral arteries are patent antegrade flow  ASSESSMENT:    1. Coronary artery disease involving native coronary artery of native heart without angina pectoris   2. Mixed hyperlipidemia      PLAN:  In order of problems listed above:  1. Patient has CAD based on remote CT showing coronary calcification. He denies any symptoms. Family notes some dyspnea but he denies. This may be related to deconditioning. Echo was unremarkable and he has normal EF. He does not want to consider stress testing and given his lack of symptoms I cannot say that this is really necessary. Recommend risk factor modification with ASA and statin therapy. If in the future he should develop symptoms of chest pain or increased DOE then stress testing would be advisable. Otherwise I will see as needed.    Medication Adjustments/Labs and Tests Ordered: Current medicines are reviewed at length with the patient today.  Concerns regarding medicines are outlined above.  Medication changes, Labs and Tests ordered today are listed in the Patient Instructions below. Patient Instructions  Continue your current therapy and Rehab exercises  If in the future you notice symptoms of worsening shortness of breath or chest pain please call me and we will evaluate  I will see you as needed.    Signed, Peter SwazilandJordan, MD  06/16/2016 5:34 PM    [facility] [address], [place], [place],  [id number] [phone]

## 2016-06-16 ENCOUNTER — Encounter: Payer: Self-pay | Admitting: Cardiology

## 2016-06-16 ENCOUNTER — Ambulatory Visit (INDEPENDENT_AMBULATORY_CARE_PROVIDER_SITE_OTHER): Payer: Medicare Other | Admitting: Cardiology

## 2016-06-16 VITALS — BP 136/80 | HR 99 | Ht 74.0 in | Wt 260.6 lb

## 2016-06-16 DIAGNOSIS — I2510 Atherosclerotic heart disease of native coronary artery without angina pectoris: Secondary | ICD-10-CM | POA: Diagnosis not present

## 2016-06-16 DIAGNOSIS — E782 Mixed hyperlipidemia: Secondary | ICD-10-CM | POA: Diagnosis not present

## 2016-06-16 NOTE — Patient Instructions (Signed)
Continue your current therapy and Rehab exercises  If in the future you notice symptoms of worsening shortness of breath or chest pain please call me and we will evaluate  I will see you as needed.

## 2017-04-27 ENCOUNTER — Inpatient Hospital Stay (HOSPITAL_COMMUNITY)
Admission: EM | Admit: 2017-04-27 | Discharge: 2017-04-29 | DRG: 065 | Disposition: A | Discharge status: Home Health | Payer: Medicare Other | Attending: Family Medicine | Admitting: Family Medicine

## 2017-04-27 ENCOUNTER — Observation Stay (HOSPITAL_COMMUNITY): Payer: Medicare Other

## 2017-04-27 ENCOUNTER — Encounter (HOSPITAL_COMMUNITY): Payer: Self-pay | Admitting: Emergency Medicine

## 2017-04-27 DIAGNOSIS — E781 Pure hyperglyceridemia: Secondary | ICD-10-CM | POA: Diagnosis present

## 2017-04-27 DIAGNOSIS — E669 Obesity, unspecified: Secondary | ICD-10-CM | POA: Diagnosis present

## 2017-04-27 DIAGNOSIS — W19XXXA Unspecified fall, initial encounter: Secondary | ICD-10-CM

## 2017-04-27 DIAGNOSIS — R402142 Coma scale, eyes open, spontaneous, at arrival to emergency department: Secondary | ICD-10-CM | POA: Diagnosis present

## 2017-04-27 DIAGNOSIS — R29701 NIHSS score 1: Secondary | ICD-10-CM | POA: Diagnosis present

## 2017-04-27 DIAGNOSIS — R402252 Coma scale, best verbal response, oriented, at arrival to emergency department: Secondary | ICD-10-CM | POA: Diagnosis present

## 2017-04-27 DIAGNOSIS — I10 Essential (primary) hypertension: Secondary | ICD-10-CM

## 2017-04-27 DIAGNOSIS — Z8673 Personal history of transient ischemic attack (TIA), and cerebral infarction without residual deficits: Secondary | ICD-10-CM | POA: Diagnosis not present

## 2017-04-27 DIAGNOSIS — F02818 Dementia in other diseases classified elsewhere, unspecified severity, with other behavioral disturbance: Secondary | ICD-10-CM

## 2017-04-27 DIAGNOSIS — Z7982 Long term (current) use of aspirin: Secondary | ICD-10-CM | POA: Diagnosis not present

## 2017-04-27 DIAGNOSIS — G301 Alzheimer's disease with late onset: Secondary | ICD-10-CM

## 2017-04-27 DIAGNOSIS — E785 Hyperlipidemia, unspecified: Secondary | ICD-10-CM | POA: Diagnosis present

## 2017-04-27 DIAGNOSIS — Z96649 Presence of unspecified artificial hip joint: Secondary | ICD-10-CM | POA: Diagnosis present

## 2017-04-27 DIAGNOSIS — F0281 Dementia in other diseases classified elsewhere with behavioral disturbance: Secondary | ICD-10-CM | POA: Diagnosis present

## 2017-04-27 DIAGNOSIS — R29810 Facial weakness: Secondary | ICD-10-CM | POA: Diagnosis present

## 2017-04-27 DIAGNOSIS — R402362 Coma scale, best motor response, obeys commands, at arrival to emergency department: Secondary | ICD-10-CM | POA: Diagnosis present

## 2017-04-27 DIAGNOSIS — R410 Disorientation, unspecified: Secondary | ICD-10-CM

## 2017-04-27 DIAGNOSIS — Z66 Do not resuscitate: Secondary | ICD-10-CM | POA: Diagnosis present

## 2017-04-27 DIAGNOSIS — F05 Delirium due to known physiological condition: Secondary | ICD-10-CM | POA: Diagnosis not present

## 2017-04-27 DIAGNOSIS — I639 Cerebral infarction, unspecified: Principal | ICD-10-CM | POA: Diagnosis present

## 2017-04-27 DIAGNOSIS — Z8249 Family history of ischemic heart disease and other diseases of the circulatory system: Secondary | ICD-10-CM | POA: Diagnosis not present

## 2017-04-27 DIAGNOSIS — G459 Transient cerebral ischemic attack, unspecified: Secondary | ICD-10-CM | POA: Diagnosis not present

## 2017-04-27 DIAGNOSIS — Z6833 Body mass index (BMI) 33.0-33.9, adult: Secondary | ICD-10-CM

## 2017-04-27 DIAGNOSIS — E876 Hypokalemia: Secondary | ICD-10-CM | POA: Diagnosis present

## 2017-04-27 DIAGNOSIS — R42 Dizziness and giddiness: Secondary | ICD-10-CM | POA: Diagnosis not present

## 2017-04-27 DIAGNOSIS — R471 Dysarthria and anarthria: Secondary | ICD-10-CM | POA: Diagnosis present

## 2017-04-27 DIAGNOSIS — I63342 Cerebral infarction due to thrombosis of left cerebellar artery: Secondary | ICD-10-CM | POA: Diagnosis not present

## 2017-04-27 HISTORY — DX: Cerebral infarction, unspecified: I63.9

## 2017-04-27 HISTORY — DX: Essential (primary) hypertension: I10

## 2017-04-27 HISTORY — DX: Unspecified dementia, unspecified severity, without behavioral disturbance, psychotic disturbance, mood disturbance, and anxiety: F03.90

## 2017-04-27 LAB — BASIC METABOLIC PANEL
ANION GAP: 9 (ref 5–15)
BUN: 11 mg/dL (ref 6–20)
CO2: 21 mmol/L — ABNORMAL LOW (ref 22–32)
Calcium: 8.2 mg/dL — ABNORMAL LOW (ref 8.9–10.3)
Chloride: 110 mmol/L (ref 101–111)
Creatinine, Ser: 0.83 mg/dL (ref 0.61–1.24)
GFR calc non Af Amer: >60 mL/min (ref >60)
Glucose, Bld: 120 mg/dL — ABNORMAL HIGH (ref 65–99)
POTASSIUM: 3.2 mmol/L — AB (ref 3.5–5.1)
SODIUM: 140 mmol/L (ref 135–145)

## 2017-04-27 LAB — CBC WITH DIFFERENTIAL/PLATELET
BASOS ABS: 0.1 10*3/uL (ref 0.0–0.1)
Basophils Relative: 1 %
EOS ABS: 0.3 10*3/uL (ref 0.0–0.7)
EOS PCT: 2 %
HCT: 45.5 % (ref 39.0–52.0)
HEMOGLOBIN: 15.7 g/dL (ref 13.0–17.0)
Lymphocytes Relative: 44 %
Lymphs Abs: 4.9 10*3/uL — ABNORMAL HIGH (ref 0.7–4.0)
MCH: 32.2 pg (ref 26.0–34.0)
MCHC: 34.5 g/dL (ref 30.0–36.0)
MCV: 93.4 fL (ref 78.0–100.0)
Monocytes Absolute: 0.9 10*3/uL (ref 0.1–1.0)
Monocytes Relative: 8 %
NEUTROS PCT: 45 %
Neutro Abs: 5.1 10*3/uL (ref 1.7–7.7)
Platelets: 183 10*3/uL (ref 150–400)
RBC: 4.87 MIL/uL (ref 4.22–5.81)
RDW: 14.1 % (ref 11.5–15.5)
WBC: 11.2 10*3/uL — AB (ref 4.0–10.5)

## 2017-04-27 LAB — URINALYSIS, ROUTINE W REFLEX MICROSCOPIC
Bilirubin Urine: NEGATIVE
GLUCOSE, UA: NEGATIVE mg/dL
Hgb urine dipstick: NEGATIVE
Ketones, ur: NEGATIVE mg/dL
LEUKOCYTES UA: NEGATIVE
Nitrite: NEGATIVE
PROTEIN: NEGATIVE mg/dL
Specific Gravity, Urine: 1.018 (ref 1.005–1.030)
pH: 6 (ref 5.0–8.0)

## 2017-04-27 LAB — PROTIME-INR
INR: 1.04
Prothrombin Time: 13.5 seconds (ref 11.4–15.2)

## 2017-04-27 LAB — RAPID URINE DRUG SCREEN, HOSP PERFORMED
Amphetamines: NOT DETECTED
BARBITURATES: NOT DETECTED
Benzodiazepines: NOT DETECTED
COCAINE: NOT DETECTED
Opiates: NOT DETECTED
Tetrahydrocannabinol: NOT DETECTED

## 2017-04-27 LAB — TROPONIN I: Troponin I: <0.03 ng/mL (ref <0.03)

## 2017-04-27 LAB — ETHANOL

## 2017-04-27 LAB — I-STAT TROPONIN, ED: TROPONIN I, POC: 0.01 ng/mL (ref 0.00–0.08)

## 2017-04-27 LAB — APTT: aPTT: 29 seconds (ref 24–36)

## 2017-04-27 MED ORDER — ACETAMINOPHEN 650 MG RE SUPP: 650.0000 mg | RECTAL | Status: DC | PRN

## 2017-04-27 MED ORDER — ASPIRIN 81 MG PO CHEW
324.0000 mg | CHEWABLE_TABLET | Freq: Every day | ORAL | Status: DC
  Administered 2017-04-28 – 2017-04-29 (×2): 324 mg via ORAL
  Filled 2017-04-27 (×2): qty 4

## 2017-04-27 MED ORDER — DONEPEZIL HCL 10 MG PO TABS
10.0000 mg | ORAL_TABLET | Freq: Every day | ORAL | Status: DC
  Administered 2017-04-27 – 2017-04-28 (×2): 10 mg via ORAL
  Filled 2017-04-27 (×2): qty 1

## 2017-04-27 MED ORDER — ATORVASTATIN CALCIUM 40 MG PO TABS
40.0000 mg | ORAL_TABLET | Freq: Every day | ORAL | Status: DC
  Administered 2017-04-28 – 2017-04-29 (×2): 40 mg via ORAL
  Filled 2017-04-27 (×2): qty 1

## 2017-04-27 MED ORDER — LACTATED RINGERS IV BOLUS (SEPSIS)
500.0000 mL | Freq: Once | INTRAVENOUS | Status: AC
  Administered 2017-04-27: 500 mL via INTRAVENOUS

## 2017-04-27 MED ORDER — POTASSIUM CHLORIDE CRYS ER 20 MEQ PO TBCR
40.0000 meq | EXTENDED_RELEASE_TABLET | Freq: Once | ORAL | Status: AC
  Administered 2017-04-27: 40 meq via ORAL
  Filled 2017-04-27: qty 2

## 2017-04-27 MED ORDER — STROKE: EARLY STAGES OF RECOVERY BOOK
Freq: Once | Status: AC
  Administered 2017-04-27: 22:00:00
  Filled 2017-04-27: qty 1

## 2017-04-27 MED ORDER — SENNOSIDES-DOCUSATE SODIUM 8.6-50 MG PO TABS: 1.0000 | ORAL_TABLET | Freq: Every day | ORAL | Status: DC | PRN

## 2017-04-27 MED ORDER — ASPIRIN 81 MG PO CHEW
324.0000 mg | CHEWABLE_TABLET | Freq: Once | ORAL | Status: AC
  Administered 2017-04-27: 324 mg via ORAL
  Filled 2017-04-27: qty 4

## 2017-04-27 MED ORDER — ACETAMINOPHEN 325 MG PO TABS
650.0000 mg | ORAL_TABLET | ORAL | Status: DC | PRN
  Administered 2017-04-28: 650 mg via ORAL
  Filled 2017-04-27: qty 2

## 2017-04-27 MED ORDER — ACETAMINOPHEN 160 MG/5ML PO SOLN: 650.0000 mg | ORAL | Status: DC | PRN

## 2017-04-27 MED ORDER — ACETAMINOPHEN 325 MG PO TABS: 325.0000 mg | ORAL_TABLET | Freq: Once | ORAL | Status: DC

## 2017-04-27 MED ORDER — SENNA-DOCUSATE SODIUM 8.6-50 MG PO TABS: 1.0000 | ORAL_TABLET | Freq: Every day | ORAL | Status: DC | PRN

## 2017-04-27 MED ORDER — SODIUM CHLORIDE 0.9 % IV SOLN
INTRAVENOUS | Status: AC
  Administered 2017-04-27: 22:00:00 via INTRAVENOUS

## 2017-04-27 MED ORDER — ENOXAPARIN SODIUM 40 MG/0.4ML ~~LOC~~ SOLN
40.0000 mg | SUBCUTANEOUS | Status: DC
  Administered 2017-04-27 – 2017-04-28 (×2): 40 mg via SUBCUTANEOUS
  Filled 2017-04-27 (×2): qty 0.4

## 2017-04-27 NOTE — Progress Notes (Signed)
Only obtained AX diffusion during MRI. Pt began beating on scanner and pulling off head coil. Pt did not want to continue. Turned in images for AX diffusion.

## 2017-04-27 NOTE — ED Triage Notes (Signed)
Pt here from nursing home with c/o n/v and dizziness , pt received 4mg  of zofran by ems, cbg 134

## 2017-04-27 NOTE — H&P (Signed)
[facility] History and Physical  DELAINE CANTER ZOX:[id number] DOB: [birth year] DOA: 04/27/2017  Referring physician:  PCP: Lauro Regulus, MD   Chief Complaint: "I feel better."  HPI: Kelly [name] is a 77 y.o. male past medical history significant for dementia and hypertension presents emergency room with acute onset of nausea vomiting headache weakness.  Patient has no history of stroke.  Had episode earlier of dizziness nausea vomiting.  History unclear.  Does have history of transient ischemic attacks. Neuro was consulted.  ED course: CT of the head negative for acute stroke.  EDP consulted neurology.  MRI did show stroke.  Hospitalist consulted for admission.   Review of Systems:  As per HPI otherwise 10 point review of systems negative.    Past Medical History:  Diagnosis Date  . Dementia   . Hypertension   . Hypertriglyceridemia    Past Surgical History:  Procedure Laterality Date  . HAND SURGERY    . TOTAL HIP ARTHROPLASTY     Left   Social History:  reports that  has never smoked. he has never used smokeless tobacco. He reports that he does not drink alcohol or use drugs.  No Known Allergies  Family History  Problem Relation Age of Onset  . Heart failure Father   . Hypertension Father      Prior to Admission medications   Medication Sig Start Date End Date Taking? Authorizing Provider  aspirin 81 MG chewable tablet Chew 81 mg by mouth daily.   Yes [provider]  atorvastatin (LIPITOR) 40 MG tablet Take 40 mg by mouth daily. 05/03/16 05/03/17 Yes [provider]  clotrimazole (LOTRIMIN) 1 % cream Apply 1 application topically daily as needed.   Yes [provider]  donepezil (ARICEPT) 10 MG tablet Take 10 mg by mouth at bedtime. 03/15/17  Yes [provider]  nystatin (MYCOSTATIN/NYSTOP) powder Apply 1 application topically 3 (three) times daily as needed. 08/16/16 08/16/17 Yes [provider]    sennosides-docusate sodium (SENOKOT-S) 8.6-50 MG tablet Take 1 tablet by mouth as needed.    Yes [provider]   Physical Exam: Vitals:   04/27/17 1730 04/27/17 1745 04/27/17 1815 04/27/17 2100  BP: (!) 148/88 (!) 149/85 (!) 152/90 (!) 161/84  Pulse:   (!) 44 76  Resp: (!) 25 16 (!) 23 20  Temp:    98.3 F (36.8 C)  TempSrc:    Oral  SpO2:   (!) 88%   Weight:    118.9 kg (262 lb 2 oz)    Wt Readings from Last 3 Encounters:  04/27/17 118.9 kg (262 lb 2 oz)  06/16/16 118.2 kg (260 lb 9.6 oz)  03/24/16 113.9 kg (251 lb)    General:  Appears calm and comfortable; A&Ox3 Eyes:  PERRL, EOMI, normal lids, iris ENT:  grossly normal hearing, lips & tongue Neck:  no LAD, masses or thyromegaly Cardiovascular:  RRR, no m/r/g. No LE edema.  Respiratory:  CTA bilaterally, no w/r/r. Normal respiratory effort. Abdomen:  soft, ntnd Skin:  no rash or induration seen on limited exam Musculoskeletal:  grossly normal tone BUE/BLE Psychiatric:  grossly normal mood and affect, speech fluent and appropriate Neurologic:  CN 2-12 grossly intact, moves all extremities in coordinated fashion.          Labs on Admission:  Basic Metabolic Panel: Recent Labs  Lab 04/27/17 1337  NA 140  K 3.2*  CL 110  CO2 21*  GLUCOSE 120*  BUN 11  CREATININE 0.83  CALCIUM 8.2*   Liver Function Tests: No results for input(s): AST, ALT, ALKPHOS, BILITOT, PROT, ALBUMIN in the last 168 hours. No results for input(s): LIPASE, AMYLASE in the last 168 hours. No results for input(s): AMMONIA in the last 168 hours. CBC: Recent Labs  Lab 04/27/17 1337  WBC 11.2*  NEUTROABS 5.1  HGB 15.7  HCT 45.5  MCV 93.4  PLT 183   Cardiac Enzymes: Recent Labs  Lab 04/27/17 1337  TROPONINI <0.03    BNP (last 3 results) No results for input(s): BNP in the last 8760 hours.  ProBNP (last 3 results) No results for input(s): PROBNP in the last 8760 hours.   Creatinine clearance cannot be calculated  (Unknown ideal weight.)  CBG: No results for input(s): GLUCAP in the last 168 hours.  Radiological Exams on Admission: Mr Brain Wo Contrast  Result Date: 04/27/2017 CLINICAL DATA:  Dizziness and fall EXAM: MRI HEAD WITHOUT CONTRAST TECHNIQUE: Multiplanar, multiecho pulse sequences of the brain and surrounding structures were obtained without intravenous contrast. COMPARISON:  Brain MRI 03/05/2016 FINDINGS: The examination had to be discontinued prior to completion due to the patient's inability to cooperate with the technologist's instructions. Only axial diffusion-weighted imaging was acquired. This shows a small, 3-4 mm focus of diffusion restriction in the medial left cerebellum. No midline shift or mass effect. No hydrocephalus. IMPRESSION: 1. Truncated examination due to patient mental status. Only the axial diffusion-weighted images could be acquired. 2. Small focus of acute ischemia within the medial left cerebellar hemisphere. Electronically Signed   By: Deatra RobinsonKevin  Herman M.D.   On: 04/27/2017 19:22    EKG: Independently reviewed. NSR, PVCs, no stemi.  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 Aspirin full dose given  And daily A1c ordered Lipid panel ordered Admitted to telemetry bed Echo ordered Carotid Dopplers ordered MRA ordered-->pending Stroke team to follow patient Neuro consulted by EDP  Hypertension When necessary hydralazine 10 mg IV as needed for severe blood pressure  Low K Replaced PO Recheck in AM  Dementia Cont aricept  Hyperlipidemia Continue statin   Code Status: DNR  DVT Prophylaxis: Lovenox Family Communication: at bedside Disposition Plan: Pending Improvement  Status: tele, inpt  Haydee SalterPhillip M Hobbs, MD Family Medicine [facility] [email or web address] Password [email or web address]

## 2017-04-27 NOTE — Progress Notes (Signed)
Report received from ED RN  Christ  Awaiting pt arrival to the unit.

## 2017-04-27 NOTE — ED Notes (Signed)
Unsuccessful attempt x 2 to call report  This time I was told that the charge nurse did not know what room and what nurse was going to take this patient

## 2017-04-27 NOTE — ED Provider Notes (Signed)
[facility] EMERGENCY DEPARTMENT Provider Note   CSN: [id number] Arrival date & time: 04/27/17  1247     History   Chief Complaint Chief Complaint  Patient presents with  . Dizziness    HPI Kelly [name] is a 77 y.o. male.  Patient with history of previous TIA -- presents with complaint of dizziness and fall just prior to arrival.  Patient states that he began feeling acutely dizzy while standing and walking.  He had the sensation that he was falling to the right side.  Patient fell backwards onto his rear end and caught himself with his hands.  He states that he did not hit his head.  He was not lightheaded and did not black out.  He did not have any sensation of spinning at the time.  He did not have any associated chest pains or shortness of breath.  He had an episode of nausea and vomiting afterwards. No neck pain.  Symptoms resolved shortly afterwards.  No recent fevers, N/V/D.  Patient denies other signs of stroke including: facial droop, slurred speech, aphasia, weakness/numbness in extremities. Typically he ambulates with a walker.  States that currently he feels well and back to normal.        Past Medical History:  Diagnosis Date  . Hypertriglyceridemia     Patient Active Problem List   Diagnosis Date Noted  . 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 CAD (coronary artery disease) 10/26/2010  . Hyperlipidemia 10/26/2010  . SHORTNESS OF BREATH 07/18/2009    Past Surgical History:  Procedure Laterality Date  . HAND SURGERY    . TOTAL HIP ARTHROPLASTY     Left       Home Medications    Prior to Admission medications   Medication Sig Start Date End Date Taking? Authorizing Provider  aspirin 81 MG chewable tablet Chew 81 mg by mouth daily.    [provider]  atorvastatin (LIPITOR) 40 MG tablet Take 40 mg by mouth daily. 05/03/16 05/03/17  [provider]  sennosides-docusate sodium (SENOKOT-S) 8.6-50 MG tablet Take 1 tablet by mouth  at bedtime.    [provider]    Family History Family History  Problem Relation Age of Onset  . Heart failure Father   . Hypertension Father     Social History Social History   Tobacco Use  . Smoking status: Never Smoker  . Smokeless tobacco: Never Used  Substance Use Topics  . Alcohol use: No  . Drug use: No     Allergies   Patient has no known allergies.   Review of Systems Review of Systems  Constitutional: Negative for fever.  HENT: Negative for congestion, dental problem, rhinorrhea and sinus pressure.   Eyes: Negative for photophobia, discharge, redness and visual disturbance.  Respiratory: Negative for shortness of breath.   Cardiovascular: Negative for chest pain.  Gastrointestinal: Positive for nausea and vomiting.  Musculoskeletal: Negative for gait problem, neck pain and neck stiffness.  Skin: Negative for rash.  Neurological: Positive for dizziness. Negative for syncope, speech difficulty, weakness, light-headedness, numbness and headaches.  Psychiatric/Behavioral: Negative for confusion.     Physical Exam Updated Vital Signs BP (!) 137/113   Pulse 66   Temp (!) 97.5 F (36.4 C)   Resp (!) 22   SpO2 93%   Physical Exam  Constitutional: He is oriented to person, place, and time. He appears well-developed and well-nourished.  HENT:  Head: Normocephalic and atraumatic.  Right Ear: Tympanic membrane, external ear and  ear canal normal.  Left Ear: Tympanic membrane, external ear and ear canal normal.  Nose: Nose normal.  Mouth/Throat: Uvula is midline, oropharynx is clear and moist and mucous membranes are normal.  Eyes: Conjunctivae, EOM and lids are normal. Pupils are equal, round, and reactive to light. Right eye exhibits no discharge. Left eye exhibits no discharge.  No nystagmus noted.   Neck: Normal range of motion. Neck supple.  Cardiovascular: Normal rate, regular rhythm and normal heart sounds.  No murmur heard. Pulmonary/Chest:  Effort normal and breath sounds normal. No stridor. No respiratory distress. He has no wheezes.  Abdominal: Soft. There is no tenderness.  Musculoskeletal: Normal range of motion.       Cervical back: He exhibits normal range of motion, no tenderness and no bony tenderness.  Neurological: He is alert and oriented to person, place, and time. He has normal strength and normal reflexes. No cranial nerve deficit or sensory deficit. He exhibits normal muscle tone. He displays a negative Romberg sign. Coordination and gait normal. GCS eye subscore is 4. GCS verbal subscore is 5. GCS motor subscore is 6.  Patient ambulated at baseline with a walker.   Skin: Skin is warm and dry.  Psychiatric: He has a normal mood and affect.  Nursing note and vitals reviewed.    ED Treatments / Results  Labs (all labs ordered are listed, but only abnormal results are displayed) Labs Reviewed  CBC WITH DIFFERENTIAL/PLATELET - Abnormal; Notable for the following components:      Result Value   WBC 11.2 (*)    Lymphs Abs 4.9 (*)    All other components within normal limits  BASIC METABOLIC PANEL - Abnormal; Notable for the following components:   Potassium 3.2 (*)    CO2 21 (*)    Glucose, Bld 120 (*)    Calcium 8.2 (*)    All other components within normal limits  TROPONIN I  ETHANOL  PROTIME-INR  APTT  RAPID URINE DRUG SCREEN, HOSP PERFORMED  URINALYSIS, ROUTINE W REFLEX MICROSCOPIC  I-STAT TROPONIN, ED    EKG  EKG Interpretation  Date/Time:  Wednesday April 27 2017 13:58:47 EST Ventricular Rate:  65 PR Interval:    QRS Duration: 137 QT Interval:  471 QTC Calculation: 490 R Axis:   -66 Text Interpretation:  Sinus rhythm Ventricular premature complex Borderline prolonged PR interval Right bundle branch block No significant change since last tracing Confirmed by Shaune PollackIsaacs, Cameron [phone]([id number]) on 04/27/2017 3:05:14 PM       Radiology No results found.  Procedures Procedures (including critical  care time)  Medications Ordered in ED Medications  lactated ringers bolus 500 mL (500 mLs Intravenous New Bag/Given 04/27/17 1658)  potassium chloride SA (K-DUR,KLOR-CON) CR tablet 40 mEq (40 mEq Oral Given 04/27/17 1658)     Initial Impression / Assessment and Plan / ED Course  I have reviewed the triage vital signs and the nursing notes.  Pertinent labs & imaging results that were available during my care of the patient were reviewed by me and considered in my medical decision making (see chart for details).     Patient seen and examined. Work-up initiated. Pt seems to be asymptomatic at time of exam.  He is a poor historian.   Vital signs reviewed and are as follows: BP (!) 137/113   Pulse 66   Temp (!) 97.5 F (36.4 C)   Resp (!) 22   SpO2 93%   Patient discussed with and seen by Dr. Erma HeritageIsaacs.  Etiology of symptoms are unclear, given previous history of TIA, there is possibility that these symptoms could be recurrent TIA.  Last full TIA workup was 02/2016 at [facility]  Discussed case with Dr. Otelia Limes of neurology.  Reviewed previous workup.  Given last work-up > 1 year, high ABCD2 score --feel patient to be admitted for repeat TIA evaluation.  Pt updated and is agreeable.   6:17 PM Handoff to [employer] at shift change, she will talk with admitting provider.   Final Clinical Impressions(s) / ED Diagnoses   Final diagnoses:  TIA (transient ischemic attack)  Fall, initial encounter  Hypokalemia   Admit for TIA work-up, fall and sensation of falling to the right side with N/V. Symptoms now improved.   ED Discharge Orders    None       Renne Crigler, Cordelia Poche 04/27/17 1818    Shaune Pollack, MD 04/27/17 [phone]

## 2017-04-27 NOTE — Consult Note (Signed)
Requesting Physician: Emmit Alexanders [facility]    Chief Complaint: Dizziness  History obtained from: Patient and Chart   HPI:                                                                                                                                       Kelly [name] is an 77 y.o. male with PMH for dementia, HLD, HTN, TIA  presents to ER for nausea, vomiting, headache and dizziness. States 2 days ago he fell suddenly and has been off. He is a poor historian but came to ER because of dizziness, headache. No longer complaining of any symptoms. BP was 137 systolic.   On assessment, patient drowsy has he had received haldol for agitation.   Date last known well: 2 days ago tPA Given: no, outside window NIHSS: 1 Baseline MRS 1    Past Medical History:  Diagnosis Date  . Hypertriglyceridemia     Past Surgical History:  Procedure Laterality Date  . HAND SURGERY    . TOTAL HIP ARTHROPLASTY     Left    Family History  Problem Relation Age of Onset  . Heart failure Father   . Hypertension Father    Social History:  reports that  has never smoked. he has never used smokeless tobacco. He reports that he does not drink alcohol or use drugs.  Allergies: No Known Allergies  Medications:                                                                                                                        I reviewed home medications   ROS:                                                                                                                                     14  systems reviewed and negative except above    Examination:                                                                                                      General: Appears well-developed and well-nourished.  Psych: Affect appropriate to situation Eyes: No scleral injection HENT: No OP obstrucion Head: Normocephalic.  Cardiovascular: Normal rate and regular rhythm.  Respiratory: Effort normal and breath sounds  normal to anterior ascultation GI: Soft.  No distension. There is no tenderness.  Skin: WDI   Neurological Examination Mental Status: Drowsy oriented to himself, thought content appropriate.  Speech fluent without evidence of aphasia. Able to follow 3 step commands without difficulty. Cranial Nerves: II: Visual fields grossly normal,  III,IV, VI: ptosis not present, extra-ocular motions intact bilaterally, pupils equal, round, reactive to light and accommodation V,VII: left facial droop VIII: hearing normal bilaterally IX,X: uvula rises symmetrically XI: bilateral shoulder shrug XII: midline tongue extension Motor: Right : Upper extremity   5/5    Left:     Upper extremity   5/5  Lower extremity   5/5     Lower extremity   5/5 Tone and bulk:normal tone throughout; no atrophy noted Sensory: Pinprick and light touch intact throughout, bilaterally Deep Tendon Reflexes: 2+ and symmetric throughout Plantars: Right: downgoing   Left: downgoing Cerebellar: Slight difficulty heel to shin on left Gait: normal gait and station     Lab Results: Basic Metabolic Panel: Recent Labs  Lab 04/27/17 1337  NA 140  K 3.2*  CL 110  CO2 21*  GLUCOSE 120*  BUN 11  CREATININE 0.83  CALCIUM 8.2*    CBC: Recent Labs  Lab 04/27/17 1337  WBC 11.2*  NEUTROABS 5.1  HGB 15.7  HCT 45.5  MCV 93.4  PLT 183    Coagulation Studies: Recent Labs    04/27/17 1725  LABPROT 13.5  INR 1.04    Imaging: Mr Brain Wo Contrast  Result Date: 04/27/2017 CLINICAL DATA:  Dizziness and fall EXAM: MRI HEAD WITHOUT CONTRAST TECHNIQUE: Multiplanar, multiecho pulse sequences of the brain and surrounding structures were obtained without intravenous contrast. COMPARISON:  Brain MRI 03/05/2016 FINDINGS: The examination had to be discontinued prior to completion due to the patient's inability to cooperate with the technologist's instructions. Only axial diffusion-weighted imaging was acquired. This shows a  small, 3-4 mm focus of diffusion restriction in the medial left cerebellum. No midline shift or mass effect. No hydrocephalus. IMPRESSION: 1. Truncated examination due to patient mental status. Only the axial diffusion-weighted images could be acquired. 2. Small focus of acute ischemia within the medial left cerebellar hemisphere. Electronically Signed   By: Deatra Robinson M.D.   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   You can look them up on [email or web address]  Password [email or web address]   Allene Furuya [facility] Pager Number [phone]

## 2017-04-27 NOTE — ED Notes (Signed)
Pt returned from MRI , unable to finished the MRI due to pt wanting to stop

## 2017-04-27 NOTE — ED Notes (Signed)
Pt  Able to ambulate with his walker with no problem

## 2017-04-27 NOTE — ED Notes (Signed)
Report given to esther rn

## 2017-04-27 NOTE — ED Notes (Signed)
Admitting doctor at the bedside 

## 2017-04-27 NOTE — ED Notes (Signed)
Unsuccessful attempt to call report   Told to call back in 10 minutes

## 2017-04-28 ENCOUNTER — Other Ambulatory Visit: Payer: Self-pay

## 2017-04-28 ENCOUNTER — Encounter (HOSPITAL_COMMUNITY): Payer: Self-pay | Admitting: *Deleted

## 2017-04-28 ENCOUNTER — Inpatient Hospital Stay (HOSPITAL_COMMUNITY): Payer: Medicare Other

## 2017-04-28 ENCOUNTER — Other Ambulatory Visit (HOSPITAL_COMMUNITY): Payer: Medicare Other

## 2017-04-28 DIAGNOSIS — I10 Essential (primary) hypertension: Secondary | ICD-10-CM

## 2017-04-28 DIAGNOSIS — G301 Alzheimer's disease with late onset: Secondary | ICD-10-CM

## 2017-04-28 DIAGNOSIS — F0281 Dementia in other diseases classified elsewhere with behavioral disturbance: Secondary | ICD-10-CM

## 2017-04-28 DIAGNOSIS — W19XXXA Unspecified fall, initial encounter: Secondary | ICD-10-CM

## 2017-04-28 DIAGNOSIS — F02818 Dementia in other diseases classified elsewhere, unspecified severity, with other behavioral disturbance: Secondary | ICD-10-CM

## 2017-04-28 DIAGNOSIS — E785 Hyperlipidemia, unspecified: Secondary | ICD-10-CM

## 2017-04-28 DIAGNOSIS — I63342 Cerebral infarction due to thrombosis of left cerebellar artery: Secondary | ICD-10-CM

## 2017-04-28 LAB — BASIC METABOLIC PANEL
ANION GAP: 12 (ref 5–15)
BUN: 11 mg/dL (ref 6–20)
CO2: 19 mmol/L — ABNORMAL LOW (ref 22–32)
Calcium: 8.7 mg/dL — ABNORMAL LOW (ref 8.9–10.3)
Chloride: 104 mmol/L (ref 101–111)
Creatinine, Ser: 0.9 mg/dL (ref 0.61–1.24)
Glucose, Bld: 183 mg/dL — ABNORMAL HIGH (ref 65–99)
Potassium: 3.8 mmol/L (ref 3.5–5.1)
Sodium: 135 mmol/L (ref 135–145)

## 2017-04-28 LAB — MRSA PCR SCREENING: MRSA BY PCR: NEGATIVE

## 2017-04-28 LAB — LIPID PANEL
Cholesterol: 89 mg/dL (ref 0–200)
HDL: 34 mg/dL — ABNORMAL LOW (ref >40)
LDL Cholesterol: 33 mg/dL (ref 0–99)
TRIGLYCERIDES: 110 mg/dL (ref <150)
Total CHOL/HDL Ratio: 2.6 RATIO
VLDL: 22 mg/dL (ref 0–40)

## 2017-04-28 LAB — HEMOGLOBIN A1C
Hgb A1c MFr Bld: 6.2 % — ABNORMAL HIGH (ref 4.8–5.6)
MEAN PLASMA GLUCOSE: 131.24 mg/dL

## 2017-04-28 MED ORDER — HALOPERIDOL LACTATE 5 MG/ML IJ SOLN
3.0000 mg | Freq: Once | INTRAMUSCULAR | Status: AC
  Administered 2017-04-28: 3 mg via INTRAVENOUS
  Filled 2017-04-28: qty 1

## 2017-04-28 MED ORDER — IOPAMIDOL (ISOVUE-370) INJECTION 76%
INTRAVENOUS | Status: AC
  Administered 2017-04-28: 50 mL
  Filled 2017-04-28: qty 50

## 2017-04-28 MED ORDER — LORAZEPAM 2 MG/ML IJ SOLN
0.5000 mg | Freq: Four times a day (QID) | INTRAMUSCULAR | Status: DC | PRN
  Administered 2017-04-28: 0.5 mg via INTRAVENOUS
  Filled 2017-04-28: qty 1

## 2017-04-28 NOTE — Progress Notes (Signed)
NEUROHOSPITALISTS STROKE TEAM - DAILY PROGRESS NOTE   SUBJECTIVE (INTERVAL HISTORY)  No family is at the bedside. Patient is found laying in bed in NAD. Overall he feels his condition is gradually improving. Voices no new complaints. No new events reported overnight.    OBJECTIVE Lab Results: CBC:  Recent Labs  Lab 04/27/17 1337  WBC 11.2*  HGB 15.7  HCT 45.5  MCV 93.4  PLT 183   BMP: Recent Labs  Lab 04/27/17 1337 04/28/17 0015  NA 140 135  K 3.2* 3.8  CL 110 104  CO2 21* 19*  GLUCOSE 120* 183*  BUN 11 11  CREATININE 0.83 0.90  CALCIUM 8.2* 8.7*   Cardiac Enzymes:  Recent Labs  Lab 04/27/17 1337  TROPONINI <0.03   Coagulation Studies:  Recent Labs    04/27/17 1725  APTT 29  INR 1.04   PHYSICAL EXAM Temp:  [97.5 F (36.4 C)-98.3 F (36.8 C)] 98 F (36.7 C) (01/17 0928) Pulse Rate:  [30-90] 90 (01/17 0928) Resp:  [14-25] 16 (01/17 0500) BP: (120-178)/(72-113) 154/91 (01/17 0928) SpO2:  [88 %-96 %] 95 % (01/17 0928) Weight:  [118.9 kg (262 lb 2 oz)] 118.9 kg (262 lb 2 oz) (01/16 2100) General - Well nourished, well developed, in no apparent distress HEENT-  Normocephalic,    Cardiovascular - Regular rate and rhythm  Respiratory - Lungs clear bilaterally. No wheezing. Abdomen - soft and non-tender, BS normal Extremities- no edema or cyanosis  Neurological Examination Mental Status: Drowsy oriented to himself, thought content appropriate. Speech fluent without evidence of aphasia. Able to follow 3 step commands without difficulty. Cranial Nerves: II: Visual fields grossly normal,  III,IV, VI: ptosis not present, extra-ocular motions intact bilaterally, pupils equal, round, reactive to light and accommodation V,VII: left facial droop - per patient is a baseline finding VIII: hearing normal bilaterally IX,X: uvula rises symmetrically XI: bilateral shoulder shrug XII: midline tongue  extension Motor: Right : Upper extremity   5/5    Left:     Upper extremity   5/5  Lower extremity   5/5     Lower extremity   5/5 Tone and bulk:normal tone throughout; no atrophy noted Sensory: Pinprick and light touch intact throughout, bilaterally Deep Tendon Reflexes: 2+ and symmetric throughout Plantars: Right: downgoing   Left: downgoing Cerebellar: Slight difficulty heel to shin on left Gait: normal gait and station  IMAGING: I have personally reviewed the radiological images below and agree with the radiology interpretations. Mr Brain Wo Contrast Result Date: 04/27/2017 IMPRESSION: 1. Truncated examination due to patient mental status. Only the axial diffusion-weighted images could be acquired. 2. Small focus of acute ischemia within the medial left cerebellar hemisphere. Electronically Signed   By: Deatra Robinson M.D.   On: 04/27/2017 19:22   CTA Head/ Neck                                                 PENDING Echocardiogram:                                              PENDING    IMPRESSION: Kelly [name] is a 77 y.o. male with PMH of dementia, HLD, HTN, TIA  2  day history of acute onset of dizziness, headache. MRI reveals:  Small focus of acute ischemia within the medial left cerebellar hemisphere  Suspected Etiology: likely small vessel disease Resultant Symptoms: dizziness, headache Stroke Risk Factors: hyperlipidemia and hypertension, Hx TIA Other Stroke Risk Factors: Advanced age, Obesity, Body mass index is 33.66 kg/m.   Outstanding Stroke Work-up Studies:     Echocardiogram:                                                    PENDING CTA Head/ Neck                                                     PENDING  04/28/2017 ASSESSMENT:   Neuro exam non-focal with Left facial droop, likely a baseline finding. Patient is a poor historian. No family at bedside. Await results of ECHO and CTA imagaing.  PLAN  04/28/2017: Continue Aspirin/ Statin Frequent neuro  checks Telemetry monitoring PT/OT/SLP Consult PM & Rehab Consult Case Management /MSW Ongoing aggressive stroke risk factor management Patient's family will be counseled to be compliant with his antithrombotic medications Patient's family will be counseled on Lifestyle modifications including, Diet, Exercise, and Stress Follow up with [facility] in 6 weeks  Rule out INTRACRANIAL Atherosclerosis &Stenosis: CTA Head/ Neck pending Will add DAPT, continue for 3 months and then Plavix alone if CTA Positive for IC stenosis  HYPERTENSION: Stable, some elevated B/P's noted overnight Permissive hypertension (OK if <220/120) for 24-48 hours post stroke and then gradually normalized within 5-7 days. Long term BP goal normotensive. May slowly restart home B/P medications after 48 hours Home Meds: NONE  HYPERLIPIDEMIA:    Component Value Date/Time   CHOL 89 04/28/2017 0015   TRIG 110 04/28/2017 0015   HDL 34 (L) 04/28/2017 0015   CHOLHDL 2.6 04/28/2017 0015   VLDL 22 04/28/2017 0015   LDLCALC 33 04/28/2017 0015  Home Meds:  Lipitor 40 mg LDL  goal < 70 Continued on Lipitor to 40 mg daily Continue statin at discharge  PRE- DIABETES: Lab Results  Component Value Date   HGBA1C 6.2 (H) 04/28/2017  HgbA1c goal < 7.0  OBESITY Obesity, Body mass index is 33.66 kg/m. Greater than/equal to 30  Other Active Problems: Active Problems:   TIA (transient ischemic attack)   CVA (cerebral vascular accident) [facility])    Hospital day # 1 VTE prophylaxis: Lovenox Diet : Diet regular Room service appropriate? Yes; Fluid consistency: Thin   FAMILY UPDATES: No family at bedside  TEAM UPDATES: Rhetta Mura, MD   Prior Home Stroke Medications:  aspirin 81 mg daily  Discharge Stroke Meds:  Please discharge patient on aspirin 325 mg daily and Lipitor 40 mg  Disposition: 03-Skilled Nursing Facility Therapy Recs:               PENDING Home Equipment:          PENDING Follow Up:  Follow-up Information    Nilda Riggs, NP. Schedule an appointment as soon as possible for a visit in 6 week(s).   Specialty:  Family Medicine Contact information: [address] [place] [address] [phone]  Lauro RegulusAnderson, Marshall W, MD -PCP Follow up in 1-2 weeks   Case Management aware of need   Assessment & plan discussed with with attending physician and they are in agreement.    Brita RompMary A Costello, ANP-C Stroke Neurology Team 04/28/2017 11:54 AM  ATTENDING NOTE: I reviewed above note and agree with the assessment and plan. I have made any additions or clarifications directly to the above note. Pt was seen and examined.   77 year old male with history of, hyperlipidemia, dementia, TIA admitted for headache, dizziness, nausea vomiting for 4 days with a fall.  MRI showed left small cerebellar infarct symptom resolved.  CT head neck pending, 2D echo pending, LDL 33 and A1c 6.2.  UDS negative.  Currently is on aspirin and Lipitor.  Resumed Aricept. Stroke most likely due to small vessel disease, but pending completion of stroke work up.  Patient still have agitation intermittently, trying to get out of bed.  Was given Ativan as needed.  His cerebellar stroke cannot explain his delirium, most likely it was due to baseline dementia with in hospital delirium.   Marvel PlanJindong Airam Heidecker, MD PhD Stroke Neurology 04/28/2017 7:10 PM     To contact Stroke Continuity provider, please refer to [email or web address]. After hours, contact General Neurology

## 2017-04-28 NOTE — Progress Notes (Signed)
RN was notified by lab that not enough blood was collected for a lab yesterday. Re-ordered ionized Calcium level. Awaiting results. Jillyn HiddenStone,Harmonie Verrastro R, RN

## 2017-04-28 NOTE — Progress Notes (Signed)
&*   [facility] and fainted while walking with a walker inan AL facility Admitted with dementia and hypertension. Pt A + O x 2 confused with time and month of the year.  upon admission pt was agitated and conductives   wanted to eat as he has not eaten all day.  Diet ordered and sand witchsh given the patient remained non compliance with  Plan of care. Education and explanation given with no effect , Daughter Camelia Engerri called in and informed about pt's behavior and noncompliance with care. Daughter talked to pt to cooperate with staff. Pt refused IV fluid to be stated and frequently pulls  Leads off the tele.  MD on call made aware , orders given .  Haldol 3 mg given with tylenol and pt became drowsy and slept for a while. Therefore  Restraint not started.

## 2017-04-28 NOTE — Progress Notes (Signed)
Pt restraints never started. PRNs given on day and night shift and bed and chair alarm on. Will continue to monitor pt closely. Jillyn HiddenStone,Merdis Snodgrass R, RN

## 2017-04-28 NOTE — Progress Notes (Signed)
Occupational Therapy Evaluation Patient Details Name: Kelly [name] MRN: [id number] DOB: 1941/01/30 Today's Date: 04/28/2017    History of Present Illness 77 y.o. male past medical history significant for dementia and hypertension presents emergency room with acute onset of nausea vomiting headache weakness.  MRI + Small focus of acute ischemia within the medial left cerebellar CVA.    Clinical Impression   PTA, pt living at a ALF and required min A from staff for bathing, otherwise, pt states he was able to complete his self care and mobility independently @ RW level. Pt currently requires min A for LB ADL and minguard to close S with mobility @ RW level. Recommend pt DC to ALF with HHOT. All further OT needs to be addressed by [facility].     Follow Up Recommendations  Home health OT;Supervision - Intermittent    Equipment Recommendations  None recommended by OT    Recommendations for Other Services       Precautions / Restrictions Precautions Precautions: Fall      Mobility Bed Mobility Overal bed mobility: Needs Assistance Bed Mobility: Supine to Sit     Supine to sit: Min assist     General bed mobility comments: Pt unable to problem solve how to goet to EOB; most likely would perform beter in his own environment  Transfers Overall transfer level: Needs assistance Equipment used: Rolling walker (2 wheeled) Transfers: Sit to/from Stand Sit to Stand: Supervision              Balance Overall balance assessment: Needs assistance(Fall prior to admission)   Sitting balance-Leahy Scale: Good       Standing balance-Leahy Scale: Fair                             ADL either performed or assessed with clinical judgement   ADL Overall ADL's : Needs assistance/impaired Eating/Feeding: Modified independent   Grooming: Supervision/safety;Standing   Upper Body Bathing: Supervision/ safety;Set up;Sitting   Lower Body Bathing: Minimal assistance;Sit to/from  stand   Upper Body Dressing : Set up;Sitting   Lower Body Dressing: Minimal assistance;Sit to/from stand Lower Body Dressing Details (indicate cue type and reason): difficulty donning shoes Toilet Transfer: Supervision/safety;RW   Toileting- Clothing Manipulation and Hygiene: Supervision/safety       Functional mobility during ADLs: Min guard;Rolling walker;Cueing for safety(poor positioning with RW but most likely baseline)       Vision Baseline Vision/History: No visual deficits Additional Comments: Appears at baseline     Perception Perception Comments: poor spatial awareness; bumping objects on R side at times; most likely baseline   Praxis      Pertinent Vitals/Pain Pain Assessment: No/denies pain     Hand Dominance Right   Extremity/Trunk Assessment Upper Extremity Assessment Upper Extremity Assessment: Overall WFL for tasks assessed   Lower Extremity Assessment Lower Extremity Assessment: Defer to PT evaluation   Cervical / Trunk Assessment Cervical / Trunk Assessment: Kyphotic(forward head)   Communication Communication Communication: No difficulties   Cognition Arousal/Alertness: Awake/alert Behavior During Therapy: WFL for tasks assessed/performed Overall Cognitive Status: No family/caregiver present to determine baseline cognitive functioning                                 General Comments: Appears at baseline; pt unable to recall name of his ALF   General Comments       Exercises  Shoulder Instructions      Home Living Family/patient expects to be discharged to:: Assisted living                             Home Equipment: Dan HumphreysWalker - 2 wheels;Shower seat          Prior Functioning/Environment Level of Independence: Independent with assistive device(s);Needs assistance  Gait / Transfers Assistance Needed: Ambulates with RW around apt and to dining room ADL's / Homemaking Assistance Needed: Staff assist with  bathing, otherwise pt states he completes his self care; unsure about mediciaotin managment            OT Problem List: Impaired balance (sitting and/or standing);Decreased knowledge of use of DME or AE;Decreased safety awareness      OT Treatment/Interventions:      OT Goals(Current goals can be found in the care plan section) Acute Rehab OT Goals Patient Stated Goal: to go home OT Goal Formulation: All assessment and education complete, DC therapy  OT Frequency:     Barriers to D/C:            Co-evaluation              AM-PAC PT "6 Clicks" Daily Activity     Outcome Measure Help from another person eating meals?: None Help from another person taking care of personal grooming?: None Help from another person toileting, which includes using toliet, bedpan, or urinal?: A Little Help from another person bathing (including washing, rinsing, drying)?: A Little Help from another person to put on and taking off regular upper body clothing?: None Help from another person to put on and taking off regular lower body clothing?: A Little 6 Click Score: 21   End of Session Equipment Utilized During Treatment: Gait belt;Rolling walker Nurse Communication: Mobility status  Activity Tolerance: Patient tolerated treatment well Patient left: in chair;with call bell/phone within reach;with chair alarm set  OT Visit Diagnosis: Unsteadiness on feet (R26.81)                Time: [id number] OT Time Calculation (min): 24 min Charges:  OT General Charges $OT Visit: 1 Visit OT Evaluation $OT Eval Low Complexity: 1 Low G-Codes:     Dannon Nguyenthi, OT/L  [phone] 04/28/2017  Shahana Capes,HILLARY 04/28/2017, 11:53 AM

## 2017-04-28 NOTE — Progress Notes (Signed)
Hospitalist progress note   Kelly [name]  ZOX:[id number] DOB: 12-03-1940 DOA: 04/27/2017 PCP: Lauro Regulus, MD   Specialists:   Brief Narrative:  26 M CAD, HLD, TIA 02/2016 left-sided weakness, prior TIA 01/2015, known elevated right hemidiaphragm admit with acute n/v/ha  And dizzyness Ct scan of head was neg but MRI showed stroke   Assessment & Plan:   Assessment:  The primary encounter diagnosis was TIA (transient ischemic attack). Diagnoses of Fall, initial encounter, Hypokalemia, and Cerebrovascular accident (CVA), unspecified mechanism (HCC) were also pertinent to this visit.  L MCA CVA-needs DAPT per Neuro 3 mo ASA 325 + plavix--CTA ordered and pending and further decision as per them Allow permissive htn-no meds currently Chr R Hemidiaphragm elevation MOd dementia-cont Aricept 10--monitor for agitation HLD-cont lipitor 40    DVT prophylaxis: lovenox  Code Status:   full   Family Communication:   None present  Disposition Plan:  inpatient   Consultants:   neuro  Procedures:   Echo/mri/carotids done  Cta pending  Antimicrobials:   nad   Subjective:  Awake alert but mildly confused at present-eating and drinking No distress Body mass index is 33.66 kg/m. cta b no sounds abd soft nt nd no rebound Neuro moving 4 limbs equally-reflexes deferred-power 5/5 Smile symm  Objective: Vitals:   04/27/17 2300 04/28/17 0100 04/28/17 0214 04/28/17 0500  BP: (!) 156/80 (!) 161/84 (!) 178/79 (!) 165/82  Pulse: 88 85 74 74  Resp: 18 18 18 16   Temp: 98 F (36.7 C) 97.6 F (36.4 C) 97.8 F (36.6 C) (!) 97.5 F (36.4 C)  TempSrc: Oral Oral Oral Oral  SpO2: 95% 96% 95% 94%  Weight:        Intake/Output Summary (Last 24 hours) at 04/28/2017 0833 Last data filed at 04/28/2017 0543 Gross per 24 hour  Intake 868.33 ml  Output 150 ml  Net 718.33 ml   Filed Weights   04/27/17 2100  Weight: 118.9 kg (262 lb 2 oz)    Examination:  eomi ncat perrla No  icterus, no pallor s1 s2 no m/r/g rrr abd soft nt nd no rebound no guard obese  No le edema Power 5/5 reflexes deferred   Data Reviewed: I have personally reviewed following labs and imaging studies  CBC: Recent Labs  Lab 04/27/17 1337  WBC 11.2*  NEUTROABS 5.1  HGB 15.7  HCT 45.5  MCV 93.4  PLT 183   Basic Metabolic Panel: Recent Labs  Lab 04/27/17 1337 04/28/17 0015  NA 140 135  K 3.2* 3.8  CL 110 104  CO2 21* 19*  GLUCOSE 120* 183*  BUN 11 11  CREATININE 0.83 0.90  CALCIUM 8.2* 8.7*   GFR: CrCl cannot be calculated (Unknown ideal weight.). Liver Function Tests: No results for input(s): AST, ALT, ALKPHOS, BILITOT, PROT, ALBUMIN in the last 168 hours. No results for input(s): LIPASE, AMYLASE in the last 168 hours. No results for input(s): AMMONIA in the last 168 hours. Coagulation Profile: Recent Labs  Lab 04/27/17 1725  INR 1.04   Cardiac Enzymes: Recent Labs  Lab 04/27/17 1337  TROPONINI <0.03   CBG: No results for input(s): GLUCAP in the last 168 hours. Urine analysis:    Component Value Date/Time   COLORURINE YELLOW 04/27/2017 1900   APPEARANCEUR CLEAR 04/27/2017 1900   LABSPEC 1.018 04/27/2017 1900   PHURINE 6.0 04/27/2017 1900   GLUCOSEU NEGATIVE 04/27/2017 1900   HGBUR NEGATIVE 04/27/2017 1900   BILIRUBINUR NEGATIVE 04/27/2017 1900   KETONESUR  NEGATIVE 04/27/2017 1900   PROTEINUR NEGATIVE 04/27/2017 1900   NITRITE NEGATIVE 04/27/2017 1900   LEUKOCYTESUR NEGATIVE 04/27/2017 1900    Radiology Studies: Reviewed images personally in health database   Scheduled Meds: . aspirin  324 mg Oral Daily  . atorvastatin  40 mg Oral Daily  . donepezil  10 mg Oral QHS  . enoxaparin (LOVENOX) injection  40 mg Subcutaneous Q24H   Continuous Infusions: . sodium chloride 50 mL/hr at 04/27/17 2157     LOS: 1 day    Time spent: 5925    Pleas KochJai Brody Bonneau, MD [facility](P) [phone]   If 7PM-7AM, please contact  night-coverage [email or web address] Password [email or web address] 04/28/2017, 8:33 AM

## 2017-04-28 NOTE — Evaluation (Signed)
Physical Therapy Evaluation Patient Details Name: Kelly [name] MRN: [id number] DOB: [birth year] Today's Date: 04/28/2017   History of Present Illness  77 y.o. male past medical history significant for dementia and hypertension presents emergency room with acute onset of nausea vomiting headache weakness.  MRI + Small focus of acute ischemia within the medial left cerebellar CVA.   Clinical Impression  Patient presents with decreased safety and independence with mobility.  Hard to tell how far off his baseline he is, but moves with unsafe techniques placing him at high fall risk.  Continued to deny any dizziness during mobility this session, but given new stroke and fall as well as impaired safety would recommend continued HHPT at ALF upon d/c.  PT to follow acutely if not d/c.     Follow Up Recommendations Home health PT    Equipment Recommendations  None recommended by PT    Recommendations for Other Services       Precautions / Restrictions Precautions Precautions: Fall      Mobility  Bed Mobility Overal bed mobility: Needs Assistance Bed Mobility: Supine to Sit     Supine to sit: Min assist     General bed mobility comments: Pt unable to problem solve how to goet to EOB; most likely would perform beter in his own environment  Transfers Overall transfer level: Needs assistance Equipment used: Rolling walker (2 wheeled) Transfers: Sit to/from Stand Sit to Stand: Supervision            Ambulation/Gait Ambulation/Gait assistance: Supervision Ambulation Distance (Feet): 90 Feet Assistive device: Rolling walker (2 wheeled) Gait Pattern/deviations: Step-through pattern;Decreased stride length;Trunk flexed;Shuffle     General Gait Details: Patient remains flexed throughout despite cues for posture, makes wide turns with risk for lateral LOB with walker so far out in front, but is likely his baseline  Stairs            Wheelchair Mobility    Modified  Rankin (Stroke Patients Only)       Balance Overall balance assessment: Needs assistance;History of Falls   Sitting balance-Leahy Scale: Good Sitting balance - Comments: reaches to floor to don shoes     Standing balance-Leahy Scale: Fair                               Pertinent Vitals/Pain Pain Assessment: No/denies pain    Home Living Family/patient expects to be discharged to:: Assisted living               Home Equipment: Walker - 2 wheels;Shower seat      Prior Function Level of Independence: Independent with assistive device(s);Needs assistance   Gait / Transfers Assistance Needed: Ambulates with RW around apt and to dining room  ADL's / Homemaking Assistance Needed: Staff assist with bathing, otherwise pt states he completes his self care; unsure about mediciaotin managment        Hand Dominance   Dominant Hand: Right    Extremity/Trunk Assessment   Upper Extremity Assessment Upper Extremity Assessment: Defer to OT evaluation    Lower Extremity Assessment Lower Extremity Assessment: Overall WFL for tasks assessed    Cervical / Trunk Assessment Cervical / Trunk Assessment: Kyphotic  Communication   Communication: No difficulties  Cognition Arousal/Alertness: Awake/alert Behavior During Therapy: WFL for tasks assessed/performed Overall Cognitive Status: No family/caregiver present to determine baseline cognitive functioning  General Comments: Appears at baseline; pt unable to recall name of his ALF      General Comments      Exercises     Assessment/Plan    PT Assessment Patient needs continued PT services  PT Problem List Decreased knowledge of use of DME;Decreased balance;Decreased safety awareness       PT Treatment Interventions DME instruction;Gait training;Therapeutic exercise;Neuromuscular re-education;Therapeutic activities;Functional mobility training;Balance  training;Patient/family education    PT Goals (Current goals can be found in the Care Plan section)  Acute Rehab PT Goals Patient Stated Goal: to go home PT Goal Formulation: With patient Time For Goal Achievement: 05/05/17 Potential to Achieve Goals: Good    Frequency Min 4X/week   Barriers to discharge        Co-evaluation PT/OT/SLP Co-Evaluation/Treatment: Yes Reason for Co-Treatment: To address functional/ADL transfers PT goals addressed during session: Mobility/safety with mobility         AM-PAC PT "6 Clicks" Daily Activity  Outcome Measure Difficulty turning over in bed (including adjusting bedclothes, sheets and blankets)?: A Little Difficulty moving from lying on back to sitting on the side of the bed? : Unable Difficulty sitting down on and standing up from a chair with arms (e.g., wheelchair, bedside commode, etc,.)?: A Little Help needed moving to and from a bed to chair (including a wheelchair)?: A Little Help needed walking in hospital room?: A Little Help needed climbing 3-5 steps with a railing? : A Lot 6 Click Score: 15    End of Session Equipment Utilized During Treatment: Gait belt Activity Tolerance: Patient tolerated treatment well Patient left: in chair;with chair alarm set;with call bell/phone within reach   PT Visit Diagnosis: Unsteadiness on feet (R26.81);History of falling (Z91.81)    Time: 1610-9604 PT Time Calculation (min) (ACUTE ONLY): 24 min   Charges:   PT Evaluation $PT Eval Low Complexity: 1 Low     PT G CodesSheran Lawless, [place] [phone] 04/28/2017   Elray Mcgregor 04/28/2017, 1:01 PM

## 2017-04-28 NOTE — Evaluation (Signed)
Speech Language Pathology Evaluation Patient Details Name: Kelly [name] MRN: [id number] DOB: November 21, 1940 Today's Date: 04/28/2017 Time: [id number] SLP Time Calculation (min) (ACUTE ONLY): 13 min  Problem List:  Patient Active Problem List   Diagnosis Date Noted  . TIA (transient ischemic attack) 04/27/2017  . CVA (cerebral vascular accident) (HCC) 04/27/2017  . 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 CAD (coronary artery disease) 10/26/2010  . Hyperlipidemia 10/26/2010  . SHORTNESS OF BREATH 07/18/2009   Past Medical History:  Past Medical History:  Diagnosis Date  . Dementia   . Hypertension   . Hypertriglyceridemia   . Stroke [facility](HCC)    Past Surgical History:  Past Surgical History:  Procedure Laterality Date  . HAND SURGERY    . TOTAL HIP ARTHROPLASTY     Left   HPI:  Kelly CollegeDonald R Milleris a 76 y.o.malepast medical history significant for dementia and hypertension presents emergency room with acute onset of nausea vomiting headache weakness. Patient has no history of stroke. Had episode earlier of dizziness nausea vomiting. History unclear. Does have history of transient ischemic attacks. Neurowas consulted. MRI was truncated examination due to pt's mental status. MRI revealed small focus of acute ischemia within the medial left cerebellar   Assessment / Plan / Recommendation Clinical Impression  Pt presents with overall moderate confusion which I suspect is further increased by hospitalization (unknown environment). Pt is unable to task and demonstrates agitation when cues are increased. Would recommend brief HHST follow up in pt's personal environment at ALF to assess for any acute deficits within that environment. No acute ST needs identified at this time and ST to sign off.     SLP Assessment  SLP Recommendation/Assessment: Patient does not need any further Speech Lanaguage Pathology Services SLP Visit Diagnosis: Cognitive communication deficit (R41.841)    Follow Up  Recommendations  Home health SLP    Frequency and Duration           SLP Evaluation Cognition  Overall Cognitive Status: No family/caregiver present to determine baseline cognitive functioning Arousal/Alertness: Awake/alert Orientation Level: Oriented to person;Oriented to place;Oriented to time Attention: Sustained Problem Solving: Impaired Problem Solving Impairment: Verbal basic;Functional basic Behaviors: Verbal agitation;Physical agitation;Poor frustration tolerance;Restless;Impulsive       Comprehension       Expression Expression Primary Mode of Expression: Verbal Verbal Expression Overall Verbal Expression: Appears within functional limits for tasks assessed Written Expression Dominant Hand: Right   Oral / Motor  Oral Motor/Sensory Function Overall Oral Motor/Sensory Function: Within functional limits Motor Speech Overall Motor Speech: Appears within functional limits for tasks assessed   GO                    Kelly [name] 04/28/2017, 4:44 PM

## 2017-04-29 ENCOUNTER — Inpatient Hospital Stay (HOSPITAL_COMMUNITY): Payer: Medicare Other

## 2017-04-29 DIAGNOSIS — R410 Disorientation, unspecified: Secondary | ICD-10-CM

## 2017-04-29 DIAGNOSIS — I10 Essential (primary) hypertension: Secondary | ICD-10-CM

## 2017-04-29 LAB — ECHOCARDIOGRAM COMPLETE
Height: 74 in
WEIGHTICAEL: 4194.03 [oz_av]

## 2017-04-29 LAB — CALCIUM, IONIZED: Calcium, Ionized, Serum: 5 mg/dL (ref 4.5–5.6)

## 2017-04-29 MED ORDER — ASPIRIN 81 MG PO CHEW: 324.0000 mg | CHEWABLE_TABLET | Freq: Every day | ORAL | Status: DC

## 2017-04-29 MED ORDER — PERFLUTREN LIPID MICROSPHERE
1.0000 mL | INTRAVENOUS | Status: AC | PRN
  Administered 2017-04-29: 2 mL via INTRAVENOUS

## 2017-04-29 NOTE — Progress Notes (Signed)
Physical Therapy Treatment Patient Details Name: Kelly [name] MRN: [id number] DOB: [birth year] Today's Date: 04/29/2017    History of Present Illness 77 y.o. male past medical history significant for dementia and hypertension presents emergency room with acute onset of nausea vomiting headache weakness.  MRI + Small focus of acute ischemia within the medial left cerebellar CVA.     PT Comments    Patient progressing slowly due to self limited with gait distance, but some improvement with posture during session with cues.  Continues to need assist for bed mobility.  Will need HHPT at d/c.    Follow Up Recommendations  Home health PT     Equipment Recommendations  None recommended by PT    Recommendations for Other Services       Precautions / Restrictions Precautions Precautions: Fall    Mobility  Bed Mobility   Bed Mobility: Sit to Supine       Sit to supine: Mod assist   General bed mobility comments: assist for legs onto bed to supine, initially pt sitting/standing EOB with NT assisting as pt attempting to void  Transfers Overall transfer level: Needs assistance Equipment used: Rolling walker (2 wheeled) Transfers: Sit to/from Stand Sit to Stand: Supervision         General transfer comment: assist for safety; pt impulsive  Ambulation/Gait Ambulation/Gait assistance: Min guard;Supervision Ambulation Distance (Feet): 90 Feet Assistive device: Rolling walker (2 wheeled) Gait Pattern/deviations: Step-through pattern;Decreased stride length;Trunk flexed;Shuffle     General Gait Details: cues for posture, assist on turns for safety   Stairs            Wheelchair Mobility    Modified Rankin (Stroke Patients Only) Modified Rankin (Stroke Patients Only) Pre-Morbid Rankin Score: Moderate disability Modified Rankin: Moderately severe disability     Balance Overall balance assessment: Needs assistance   Sitting balance-Leahy Scale: Good        Standing balance-Leahy Scale: Fair Standing balance comment: washes hands at sink with S                            Cognition Arousal/Alertness: Awake/alert Behavior During Therapy: WFL for tasks assessed/performed Overall Cognitive Status: No family/caregiver present to determine baseline cognitive functioning                                        Exercises      General Comments General comments (skin integrity, edema, etc.): NT states pt soiled with urine in bed, but difficulty voiding in urinal sitting/standing at EOB; assisted to sit on toilet in bathroom and pt easily able to void.      Pertinent Vitals/Pain Pain Assessment: No/denies pain    Home Living                      Prior Function            PT Goals (current goals can now be found in the care plan section) Progress towards PT goals: Progressing toward goals    Frequency    Min 4X/week      PT Plan Current plan remains appropriate    Co-evaluation              AM-PAC PT "6 Clicks" Daily Activity  Outcome Measure  Difficulty turning over in bed (including adjusting bedclothes, sheets and blankets)?:  A Little Difficulty moving from lying on back to sitting on the side of the bed? : Unable Difficulty sitting down on and standing up from a chair with arms (e.g., wheelchair, bedside commode, etc,.)?: A Little Help needed moving to and from a bed to chair (including a wheelchair)?: A Little Help needed walking in hospital room?: A Little Help needed climbing 3-5 steps with a railing? : A Lot 6 Click Score: 15    End of Session Equipment Utilized During Treatment: Gait belt Activity Tolerance: Patient tolerated treatment well Patient left: in chair;in bed;with bed alarm set;with chair alarm set;with call bell/phone within reach(in chair with alarm then heard alarm when in hallway and assisted pt back to bed)   PT Visit Diagnosis: History of falling  (Z91.81);Unsteadiness on feet (R26.81)     Time: [id number] PT Time Calculation (min) (ACUTE ONLY): 27 min  Charges:  $Gait Training: 8-22 mins $Therapeutic Activity: 8-22 mins                    G CodesSheran [name]:       Kelly [name], [place]PT [phone] 04/29/2017    Kelly [name] 04/29/2017, 4:07 PM

## 2017-04-29 NOTE — Care Management Note (Signed)
Case Management Note  Patient Details  Name: Kelly [name] MRN: [id number] Date of Birth: [birth year]  Subjective/Objective:                    Action/Plan: [facility] CM spoke to CS[facility] and [facility] No further needs per CM.   Expected Discharge Date:  04/29/17               Expected Discharge Plan:  Assisted Living / Rest Home  In-House Referral:  Clinical Social Work  Discharge planning Services  CM Consult  Post Acute Care Choice:  Home Health Choice offered to:     DME Arranged:    DME Agency:     HH Arranged:    HH Agency:     Status of Service:  Completed, signed off  If discussed at [employer] Meetings, dates discussed:    Additional Comments:  Kermit BaloKelli F Damarien Nyman, RN 04/29/2017, 4:00 PM

## 2017-04-29 NOTE — Progress Notes (Signed)
Patient will discharge to [facility] Anticipated discharge date: 1/18 Family notified: [facility] (dtr) [facility]  CSW signing off.  Burna SisJenna H. Venicia Vandall, LCSW Clinical Social Worker [phone]

## 2017-04-29 NOTE — Discharge Summary (Addendum)
Physician Discharge Summary  Kelly [name] ZOX:[id number] DOB: 1941/03/29 DOA: 04/27/2017  PCP: Lauro Regulus, MD  Admit date: 04/27/2017 Discharge date: 04/29/2017  Time spent: 35 minutes  Recommendations for Outpatient Follow-up:  1. Asa 325 for 3 mo-- lipitor 40 on d/c new meds-no changes otherwise to med 2. Follow ectatic aortic arch in 1 r with rpt  CT--no furthe rwork-up currently  Discharge Diagnoses:  Active Problems:   TIA (transient ischemic attack)   CVA (cerebral vascular accident) (HCC)   Late onset Alzheimer's disease with behavioral disturbance   Fall   Essential hypertension   Discharge Condition: improved  Diet recommendation: hh low salt  Filed Weights   04/27/17 2100 04/28/17 2326  Weight: 118.9 kg (262 lb 2 oz) 118.9 kg (262 lb 2 oz)    History of present illness: 27 M CAD, HLD, TIA 02/2016 left-sided weakness, prior TIA 01/2015, known elevated right hemidiaphragm admit with acute n/v/ha  And dizzyness Ct scan of head was neg but MRI showed stroke    Hospital Course:  L MCA CVA-needs DAPT per Neuro 3 mo ASA 325x--CTA ordered and pending and further decision as per them Allow permissive htn-no meds currently--resuemd on d./c to ALF Chr R Hemidiaphragm elevation MOd dementia-cont Aricept 10--monitor for agitation HLD-cont lipitor 40    Procedures: cta echo ct  Consultations:  neuro  CTA head and neck  1. Negative for emergent large vessel occlusion or significant large vessel stenosis. Mild stenosis at the left vertebral artery origin and left MCA M1 segment. 2. Widespread mild to moderate irregularity of 2nd order intracranial arteries. Moderate to severe stenosis of the left PCA superior P3 division. 3. Minimal atherosclerosis in the neck. 4. 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 Recommend semi-annual imaging followup by CTA or MRA and referral to cardiothoracic surgery if not already obtained. This  recommendation follows 2010 ACCF/AHA/AATS/ACR/ASA/SCA/SCAI/SIR/STS/SVM Guidelines for the Diagnosis and Management of Patients With Thoracic Aortic Disease. Circulation. 2010; 121: e266-e36   Discharge Exam: Vitals:   04/29/17 0532 04/29/17 0914  BP: (!) 176/101 (!) 166/86  Pulse: 77 84  Resp: 18 19  Temp:  98.7 F (37.1 C)  SpO2: 94% 97%    General: awake alert slight confusion but clearer than prior no other issue Cardiovascular: s1 s2 no mr/g Respiratory: clear no added sound  Discharge Instructions   Discharge Instructions    Ambulatory referral to Neurology   Complete by:  As directed    An appointment is requested in approximately: 6 weeks   Diet - low sodium heart healthy   Complete by:  As directed    Diet - low sodium heart healthy   Complete by:  As directed    Increase activity slowly   Complete by:  As directed    Increase activity slowly   Complete by:  As directed      Allergies as of 04/29/2017   No Known Allergies     Medication List    TAKE these medications   aspirin 81 MG chewable tablet Chew 4 tablets (324 mg total) by mouth daily. Start taking on:  04/30/2017 What changed:  how much to take   atorvastatin 40 MG tablet Commonly known as:  LIPITOR Take 40 mg by mouth daily.   clotrimazole 1 % cream Commonly known as:  LOTRIMIN Apply 1 application topically daily as needed.   donepezil 10 MG tablet Commonly known as:  ARICEPT Take 10 mg by mouth at bedtime.   nystatin  powder Commonly known as:  MYCOSTATIN/NYSTOP Apply 1 application topically 3 (three) times daily as needed.   sennosides-docusate sodium 8.6-50 MG tablet Commonly known as:  SENOKOT-S Take 1 tablet by mouth as needed.      No Known Allergies Follow-up Information    Morene Crocker, MD. Schedule an appointment as soon as possible for a visit in 6 week(s).   Specialty:  Neurology Contact information: [address] [facility] [place] [address] [phone]            The results of significant diagnostics from this hospitalization (including imaging, microbiology, ancillary and laboratory) are listed below for reference.    Significant Diagnostic Studies: Ct Angio Head W Or Wo Contrast  Result Date: 04/28/2017 CLINICAL DATA:  77 y/o male with dizziness and fall. Limited brain MRI suggesting acute lacunar infarct in the central left cerebellum near midline and the vermis. EXAM: CT ANGIOGRAPHY HEAD AND NECK TECHNIQUE: Multidetector CT imaging of the head and neck was performed using the standard protocol during bolus administration of intravenous contrast. Multiplanar CT image reconstructions and MIPs were obtained to evaluate the vascular anatomy. Carotid stenosis measurements (when applicable) are obtained utilizing NASCET criteria, using the distal internal carotid diameter as the denominator. CONTRAST:  50mL ISOVUE-370 IOPAMIDOL (ISOVUE-370) INJECTION 76% COMPARISON:  Limited brain MRI 04/27/2017. Head CT without contrast 03/03/2016. FINDINGS: CTA NECK Skeleton: Multilevel upper cervical spine ankylosis or arthrodesis. Superimposed cervical spine degeneration. No acute osseous abnormality identified. Upper chest: Mild pulmonary ground-glass opacity in the upper lungs. Negative visualized superior mediastinum. Other neck: No negative.  No cervical lymphadenopathy. Aortic arch: 3 vessel arch configuration. The arch appears ectatic, about 40 millimeters in diameter, but there is minimal atherosclerosis. Right carotid system: Mild tortuosity of the brachiocephalic artery. Normal right CCA origin. Minimal soft plaque at the posterior right ICA origin. Mild tortuosity. No cervical right ICA stenosis. Left carotid system: Normal left CCA origin proximal left CCA. Mildly tortuous. Mild calcified plaque at the left ICA origin and bulb without stenosis. Mild tortuosity. Vertebral arteries: Mildly tortuous  proximal right subclavian artery without plaque or stenosis. Normal right vertebral artery origin. Tortuous right V1 segment. No right vertebral artery stenosis in the neck. Mildly tortuous proximal left subclavian artery. Mild soft and calcified plaque at the left vertebral artery origin, only mild stenosis suspected. Tortuous left V1 segment. Fairly codominant vertebral arteries. No other left vertebral stenosis to the skull base. CTA HEAD Posterior circulation: Codominant distal vertebral arteries with mild irregularity and no stenosis. Right PICA origin is patent. The left AICA appears dominant with a patent origin. Patent basilar artery with mild irregularity and no stenosis. Patent SCA origins. Both posterior communicating arteries are present although the left is diminutive. Normal PCA origins. Mild bilateral P1 and P2 segment irregularity, greater on the left. Moderate to severe stenosis of the left PCA superior P3 division. Anterior circulation: Patent ICA siphons. Bilateral calcified plaque with no stenosis. Mild siphon tortuosity. Normal ophthalmic and posterior communicating artery origins. Patent carotid termini. Normal MCA and ACA origins. Anterior communicating artery within normal limits. Proximal ACA branches are normal. There is mild to moderate distal bilateral ACA branch irregularity and stenosis. Left MCA M1 segment is mildly irregular. There is mild stenosis distal to the left anterior temporal artery origin (series 10, image 24). Left MCA trifurcation is patent. Visible left MCA branches are mildly irregular. Right MCA M1 segment is patent without stenosis. Right MCA bifurcation is patent. Small PA branches  are patent mild irregular Venous sinuses: Patent on the delayed images. Anatomic variants: None. Delayed phase: Stable cerebral volume since 2017. No acute intracranial hemorrhage identified. No midline shift, mass effect, or evidence of intracranial mass lesion. The small focus of  cerebellar restricted diffusion is not evident. Brainstem and cerebellum appear stable. Patchy bilateral cerebral white matter hypodensity has not significantly changed. No cortically based acute infarct identified. No abnormal enhancement identified. Review of the MIP images confirms the above findings IMPRESSION: 1. Negative for emergent large vessel occlusion or significant large vessel stenosis. Mild stenosis at the left vertebral artery origin and left MCA M1 segment. 2. Widespread mild to moderate irregularity of 2nd order intracranial arteries. Moderate to severe stenosis of the left PCA superior P3 division. 3. Minimal atherosclerosis in the neck. 4. 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 Recommend semi-annual imaging followup by CTA or MRA and referral to cardiothoracic surgery if not already obtained. This recommendation follows 2010 ACCF/AHA/AATS/ACR/ASA/SCA/SCAI/SIR/STS/SVM Guidelines for the Diagnosis and Management of Patients With Thoracic Aortic Disease. Circulation. 2010; 121: [id number] Electronically Signed   By: Odessa Fleming M.D.   On: 04/28/2017 16:24   Ct Angio Neck W Or Wo Contrast  Result Date: 04/28/2017 CLINICAL DATA:  77 y/o male with dizziness and fall. Limited brain MRI suggesting acute lacunar infarct in the central left cerebellum near midline and the vermis. EXAM: CT ANGIOGRAPHY HEAD AND NECK TECHNIQUE: Multidetector CT imaging of the head and neck was performed using the standard protocol during bolus administration of intravenous contrast. Multiplanar CT image reconstructions and MIPs were obtained to evaluate the vascular anatomy. Carotid stenosis measurements (when applicable) are obtained utilizing NASCET criteria, using the distal internal carotid diameter as the denominator. CONTRAST:  50mL ISOVUE-370 IOPAMIDOL (ISOVUE-370) INJECTION 76% COMPARISON:  Limited brain MRI 04/27/2017. Head CT without contrast 03/03/2016. FINDINGS: CTA NECK Skeleton: Multilevel upper cervical  spine ankylosis or arthrodesis. Superimposed cervical spine degeneration. No acute osseous abnormality identified. Upper chest: Mild pulmonary ground-glass opacity in the upper lungs. Negative visualized superior mediastinum. Other neck: No negative.  No cervical lymphadenopathy. Aortic arch: 3 vessel arch configuration. The arch appears ectatic, about 40 millimeters in diameter, but there is minimal atherosclerosis. Right carotid system: Mild tortuosity of the brachiocephalic artery. Normal right CCA origin. Minimal soft plaque at the posterior right ICA origin. Mild tortuosity. No cervical right ICA stenosis. Left carotid system: Normal left CCA origin proximal left CCA. Mildly tortuous. Mild calcified plaque at the left ICA origin and bulb without stenosis. Mild tortuosity. Vertebral arteries: Mildly tortuous proximal right subclavian artery without plaque or stenosis. Normal right vertebral artery origin. Tortuous right V1 segment. No right vertebral artery stenosis in the neck. Mildly tortuous proximal left subclavian artery. Mild soft and calcified plaque at the left vertebral artery origin, only mild stenosis suspected. Tortuous left V1 segment. Fairly codominant vertebral arteries. No other left vertebral stenosis to the skull base. CTA HEAD Posterior circulation: Codominant distal vertebral arteries with mild irregularity and no stenosis. Right PICA origin is patent. The left AICA appears dominant with a patent origin. Patent basilar artery with mild irregularity and no stenosis. Patent SCA origins. Both posterior communicating arteries are present although the left is diminutive. Normal PCA origins. Mild bilateral P1 and P2 segment irregularity, greater on the left. Moderate to severe stenosis of the left PCA superior P3 division. Anterior circulation: Patent ICA siphons. Bilateral calcified plaque with no stenosis. Mild siphon tortuosity. Normal ophthalmic and posterior communicating artery origins.  Patent carotid  termini. Normal MCA and ACA origins. Anterior communicating artery within normal limits. Proximal ACA branches are normal. There is mild to moderate distal bilateral ACA branch irregularity and stenosis. Left MCA M1 segment is mildly irregular. There is mild stenosis distal to the left anterior temporal artery origin (series 10, image 24). Left MCA trifurcation is patent. Visible left MCA branches are mildly irregular. Right MCA M1 segment is patent without stenosis. Right MCA bifurcation is patent. Small PA branches are patent mild irregular Venous sinuses: Patent on the delayed images. Anatomic variants: None. Delayed phase: Stable cerebral volume since 2017. No acute intracranial hemorrhage identified. No midline shift, mass effect, or evidence of intracranial mass lesion. The small focus of cerebellar restricted diffusion is not evident. Brainstem and cerebellum appear stable. Patchy bilateral cerebral white matter hypodensity has not significantly changed. No cortically based acute infarct identified. No abnormal enhancement identified. Review of the MIP images confirms the above findings IMPRESSION: 1. Negative for emergent large vessel occlusion or significant large vessel stenosis. Mild stenosis at the left vertebral artery origin and left MCA M1 segment. 2. Widespread mild to moderate irregularity of 2nd order intracranial arteries. Moderate to severe stenosis of the left PCA superior P3 division. 3. Minimal atherosclerosis in the neck. 4. 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 Recommend semi-annual imaging followup by CTA or MRA and referral to cardiothoracic surgery if not already obtained. This recommendation follows 2010 ACCF/AHA/AATS/ACR/ASA/SCA/SCAI/SIR/STS/SVM Guidelines for the Diagnosis and Management of Patients With Thoracic Aortic Disease. Circulation. 2010; 121: Z610-R60: [id number] Electronically Signed   By: Odessa FlemingH  Hall M.D.   On: 04/28/2017 16:24   Mr Brain Wo Contrast  Result  Date: 04/27/2017 CLINICAL DATA:  Dizziness and fall EXAM: MRI HEAD WITHOUT CONTRAST TECHNIQUE: Multiplanar, multiecho pulse sequences of the brain and surrounding structures were obtained without intravenous contrast. COMPARISON:  Brain MRI 03/05/2016 FINDINGS: The examination had to be discontinued prior to completion due to the patient's inability to cooperate with the technologist's instructions. Only axial diffusion-weighted imaging was acquired. This shows a small, 3-4 mm focus of diffusion restriction in the medial left cerebellum. No midline shift or mass effect. No hydrocephalus. IMPRESSION: 1. Truncated examination due to patient mental status. Only the axial diffusion-weighted images could be acquired. 2. Small focus of acute ischemia within the medial left cerebellar hemisphere. Electronically Signed   By: Deatra RobinsonKevin  Herman M.D.   On: 04/27/2017 19:22    Microbiology: Recent Results (from the past 240 hour(s))  MRSA PCR Screening     Status: None   Collection Time: 04/27/17  9:26 PM  Result Value Ref Range Status   MRSA by PCR NEGATIVE NEGATIVE Final    Comment:        The GeneXpert MRSA Assay (FDA approved for NASAL specimens only), is one component of a comprehensive MRSA colonization surveillance program. It is not intended to diagnose MRSA infection nor to guide or monitor treatment for MRSA infections.      Labs: Basic Metabolic Panel: Recent Labs  Lab 04/27/17 1337 04/28/17 0015  NA 140 135  K 3.2* 3.8  CL 110 104  CO2 21* 19*  GLUCOSE 120* 183*  BUN 11 11  CREATININE 0.83 0.90  CALCIUM 8.2* 8.7*   Liver Function Tests: No results for input(s): AST, ALT, ALKPHOS, BILITOT, PROT, ALBUMIN in the last 168 hours. No results for input(s): LIPASE, AMYLASE in the last 168 hours. No results for input(s): AMMONIA in the last 168 hours. CBC: Recent Labs  Lab 04/27/17 1337  WBC 11.2*  NEUTROABS 5.1  HGB 15.7  HCT 45.5  MCV 93.4  PLT 183   Cardiac Enzymes: Recent  Labs  Lab 04/27/17 1337  TROPONINI <0.03   BNP: BNP (last 3 results) No results for input(s): BNP in the last 8760 hours.  ProBNP (last 3 results) No results for input(s): PROBNP in the last 8760 hours.  CBG: No results for input(s): GLUCAP in the last 168 hours.     Signed:  Rhetta Mura MD   [facility] 04/29/2017, 1:06 PM

## 2017-04-29 NOTE — NC FL2 (Signed)
[place] MEDICAID [id number] LEVEL OF CARE SCREENING TOOL     IDENTIFICATION  Patient Name: Kelly [name] Birthdate: 1940/04/27 Sex: male Admission Date (Current Location): 04/27/2017  [facility] and [place] Number:  Producer, television/film/video and Address:  [facility], [address] [place], [place] [address]      Provider Number: [id number]  Attending Physician Name and Address:  Rhetta Mura, MD  Relative Name and Phone Number:       Current Level of Care: Hospital Recommended Level of Care: Assisted Living Facility Prior Approval Number:    Date Approved/Denied:   PASRR Number:    Discharge Plan: Other (Comment)(ALF)    Current Diagnoses: Patient Active Problem List   Diagnosis Date Noted  . Late onset Alzheimer's disease with behavioral disturbance   . Fall   . Essential hypertension   . TIA (transient ischemic attack) 04/27/2017  . CVA (cerebral vascular accident) (HCC) 04/27/2017  . 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 CAD (coronary artery disease) 10/26/2010  . Hyperlipidemia 10/26/2010  . SHORTNESS OF BREATH 07/18/2009    Orientation RESPIRATION BLADDER Height & Weight     Self  Normal Continent Weight: 262 lb 2 oz (118.9 kg) Height:  6\' 2"  (188 cm)  BEHAVIORAL SYMPTOMS/MOOD NEUROLOGICAL BOWEL NUTRITION STATUS      Continent Diet(heart healthy; no added salt)  AMBULATORY STATUS COMMUNICATION OF NEEDS Skin   Limited Assist Verbally Normal                       Personal Care Assistance Level of Assistance  Bathing, Dressing Bathing Assistance: Limited assistance   Dressing Assistance: Limited assistance     Functional Limitations Info             SPECIAL CARE FACTORS FREQUENCY  PT (By licensed PT), OT (By licensed OT)     PT Frequency: 4/wk with home health OT Frequency: 4/wk with home health            Contractures      Additional Factors Info  Code Status, Allergies Code Status Info: DNR Allergies Info:  NKA           Current Medications (04/29/2017):  This is the current hospital active medication list Current Facility-Administered Medications  Medication Dose Route Frequency Provider Last Rate Last Dose  . acetaminophen (TYLENOL) tablet 650 mg  650 mg Oral Q4H PRN Haydee Salter, MD   650 mg at 04/28/17 8119   Or  . acetaminophen (TYLENOL) solution 650 mg  650 mg Per Tube Q4H PRN Haydee Salter, MD       Or  . acetaminophen (TYLENOL) suppository 650 mg  650 mg Rectal Q4H PRN Haydee Salter, MD      . aspirin chewable tablet 324 mg  324 mg Oral Daily Haydee Salter, MD   324 mg at 04/29/17 0900  . atorvastatin (LIPITOR) tablet 40 mg  40 mg Oral Daily Haydee Salter, MD   40 mg at 04/29/17 0900  . donepezil (ARICEPT) tablet 10 mg  10 mg Oral QHS Haydee Salter, MD   10 mg at 04/28/17 2147  . enoxaparin (LOVENOX) injection 40 mg  40 mg Subcutaneous Q24H Haydee Salter, MD   40 mg at 04/28/17 2148  . LORazepam (ATIVAN) injection 0.5 mg  0.5 mg Intravenous Q6H PRN Rhetta Mura, MD   0.5 mg at 04/28/17 1431  . senna-docusate (Senokot-S) tablet 1 tablet  1 tablet  Oral Daily PRN Haydee SalterHobbs, Phillip M, MD         Discharge Medications: Please see discharge summary for a list of discharge medications.  Relevant Imaging Results:  Relevant Lab Results:   Additional Information Patient will also need home health RN for continued medical management  Burna SisUris, Shellia Hartl H, LCSW

## 2017-04-29 NOTE — Progress Notes (Addendum)
NEUROHOSPITALISTS STROKE TEAM - DAILY PROGRESS NOTE   SUBJECTIVE (INTERVAL HISTORY)  No family is at the bedside. Patient is found laying in bed in NAD. No more agitation, orientated, still dysarthria, no dizziness or N/V. Stroke work up completed. Pt asking for discharge. PT/OT recommend home PT/OT.     OBJECTIVE Lab Results: CBC:  Recent Labs  Lab 04/27/17 1337  WBC 11.2*  HGB 15.7  HCT 45.5  MCV 93.4  PLT 183   BMP: Recent Labs  Lab 04/27/17 1337 04/28/17 0015  NA 140 135  K 3.2* 3.8  CL 110 104  CO2 21* 19*  GLUCOSE 120* 183*  BUN 11 11  CREATININE 0.83 0.90  CALCIUM 8.2* 8.7*   Cardiac Enzymes:  Recent Labs  Lab 04/27/17 1337  TROPONINI <0.03   Coagulation Studies:  Recent Labs    04/27/17 1725  APTT 29  INR 1.04   PHYSICAL EXAM Temp:  [97.5 F (36.4 C)-98.7 F (37.1 C)] 98.7 F (37.1 C) (01/18 0914) Pulse Rate:  [77-85] 84 (01/18 0914) Resp:  [18-19] 19 (01/18 0914) BP: (157-176)/(86-101) 166/86 (01/18 0914) SpO2:  [92 %-97 %] 97 % (01/18 0914) Weight:  [262 lb 2 oz (118.9 kg)] 262 lb 2 oz (118.9 kg) (01/17 2326) General - Well nourished, well developed, in no apparent distress HEENT-  Normocephalic,    Cardiovascular - Regular rate and rhythm  Respiratory - Lungs clear bilaterally. No wheezing. Abdomen - soft and non-tender, BS normal Extremities- no edema or cyanosis  Neurological Examination Mental Status: Drowsy oriented to himself, thought content appropriate. Speech fluent without evidence of aphasia. Able to follow 3 step commands without difficulty. Cranial Nerves: II: Visual fields grossly normal,  III,IV, VI: ptosis not present, extra-ocular motions intact bilaterally, pupils equal, round, reactive to light and accommodation V,VII: left facial droop - per patient is a baseline finding VIII: hearing normal bilaterally IX,X: uvula rises symmetrically XI: bilateral shoulder  shrug XII: midline tongue extension Motor: Right : Upper extremity   5/5    Left:     Upper extremity   5/5  Lower extremity   5/5     Lower extremity   5/5 Tone and bulk:normal tone throughout; no atrophy noted Sensory: Pinprick and light touch intact throughout, bilaterally Deep Tendon Reflexes: 2+ and symmetric throughout Plantars: Right: downgoing   Left: downgoing Cerebellar: Slight difficulty heel to shin on left Gait: normal gait and station  IMAGING: I have personally reviewed the radiological images below and agree with the radiology interpretation:    Mr Brain Wo Contrast Result Date: 04/27/2017 IMPRESSION:  1. Truncated examination due to patient mental status. Only the axial diffusion-weighted images could be acquired.  2. Small focus of acute ischemia within the medial left cerebellar hemisphere.    CTA Head/ Neck    04/29/2017 IMPRESSION: 1. Negative for emergent large vessel occlusion or significant largevessel stenosis. Mild stenosis at the left vertebral artery origin and left MCA M1 segment. 2. Widespread mild to moderate irregularity of 2nd order intracranial arteries. Moderate to severe stenosis of the left PCA superior P3 division. 3. Minimal atherosclerosis in the neck. 4. 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 Recommend semi-annual imaging followup by CTA or MRA and referral to cardiothoracic surgery if not already obtained.    Echocardiogram 04/29/2017  Study Conclusions - Left ventricle: The cavity size was normal. There was moderate   concentric hypertrophy. Systolic function was vigorous. The   estimated ejection fraction was in the range  of 65% to 70%. Wall   motion was normal; there were no regional wall motion   abnormalities. There was an increased relative contribution of   atrial contraction to ventricular filling. Doppler parameters are   consistent with abnormal left ventricular relaxation (grade 1   diastolic  dysfunction). Impressions: - Normal LVF EF 65-70%, moderate LVH, trivial TR, grade 1 DD. No   obvious source of embolism noted. No Bubble study done. Compared   to prior echo, pulmonary pressure has decreased.                                            IMPRESSION: Mr. Kelly [name] is a 77 y.o. male with PMH of dementia, HLD, HTN, TIA  2 day history of acute onset of dizziness, headache.    MRI 04/27/17 IMPRESSION:  1. Truncated examination due to patient mental status. Only the axial diffusion-weighted images could be acquired.  2. Small focus of acute ischemia within the medial left cerebellar hemisphere.    Suspected Etiology: likely small vessel disease Resultant Symptoms: dizziness, headache Stroke Risk Factors: hyperlipidemia and hypertension, Hx TIA Other Stroke Risk Factors: Advanced age, Obesity, Body mass index is 33.66 kg/m.    04/29/2017 ASSESSMENT:   Neuro exam non-focal with Left facial droop, likely a baseline finding. Patient is a poor historian. No family at bedside.   PLAN  04/29/2017: Continue Aspirin/ Statin Frequent neuro checks Telemetry monitoring PT/OT/SLP Consult PM & Rehab Consult Case Management /MSW Ongoing aggressive stroke risk factor management Patient's family will be counseled to be compliant with his antithrombotic medications Patient's family will be counseled on Lifestyle modifications including, Diet, Exercise, and Stress Follow up with [facility] in 6 weeks   HYPERTENSION: Stable, some elevated B/P's noted overnight Permissive hypertension (OK if <220/120) for 24-48 hours post stroke and then gradually normalized within 5-7 days. Long term BP goal normotensive. May slowly restart home B/P medications after 48 hours Home Meds: NONE  HYPERLIPIDEMIA: Home Meds:  Lipitor 40 mg LDL 33  goal < 70 Continued on Lipitor to 40 mg daily Continue statin at discharge  PRE- DIABETES: HgbA1c 6.2 goal <  7.0  OBESITY Obesity, Body mass index is 33.66 kg/m. Greater than/equal to 30  Other Active Problems: Active Problems:   TIA (transient ischemic attack)   CVA (cerebral vascular accident) [facility])   Late onset Alzheimer's disease with behavioral disturbance   Fall   Essential hypertension    Hospital day # 2 VTE prophylaxis: Lovenox Diet : Diet regular Room service appropriate? Yes; Fluid consistency: Thin Diet - low sodium heart healthy Diet - low sodium heart healthy   FAMILY UPDATES: No family at bedside  TEAM UPDATES: Rhetta Mura, MD   Prior Home Stroke Medications:  aspirin 81 mg daily  Discharge Stroke Meds:  Please discharge patient on aspirin 325 mg daily and Lipitor 40 mg  Disposition: 03-Skilled Nursing Facility Therapy Recs:               Home health PT and OT recommended Home Equipment:         PENDING Follow Up:  Follow-up Information    Morene Crocker, MD. Schedule an appointment as soon as possible for a visit in 6 week(s).   Specialty:  Neurology Contact information: [phone] [facility] [address] [facility] [place] [address] [phone]  Lauro RegulusAnderson, Marshall W, MD -PCP Follow up in 1-2 weeks   Case Management aware of need   Assessment & plan discussed with with attending physician and they are in agreement.      ATTENDING NOTE: I reviewed above note and agree with the assessment and plan. I have made any additions or clarifications directly to the above note. Pt was seen and examined.   77 year old male with history of, hyperlipidemia, dementia, TIA admitted for headache, dizziness, nausea vomiting for 4 days with a fall.  MRI showed left small cerebellar infarct symptom resolved. CTA head neck showed diffuse athero but no LVO. LDL 33 and A1c 6.2.  UDS negative.  Currently is on aspirin and Lipitor. ASA increased from 81 to 325mg . Continue on discharge. Resumed Aricept. Stroke most likely due to small vessel  disease. Patient had agitation during admission now resolved, most likely it was due to baseline dementia with in hospital delirium.   Neurology will sign off. Please call with questions. Pt will follow up with his neurologist Dr. Malvin JohnsPotter in about 6 weeks. Thanks for the consult.  Marvel PlanJindong Tamario Heal, MD PhD Stroke Neurology 04/29/2017 6:33 PM   To contact Stroke Continuity provider, please refer to [email or web address]. After hours, contact General Neurology

## 2017-05-17 ENCOUNTER — Telehealth: Payer: Self-pay | Admitting: Cardiology

## 2017-05-17 NOTE — Telephone Encounter (Signed)
OK with me  Peter Jordan MD, FACC   

## 2017-05-17 NOTE — Telephone Encounter (Signed)
Patient would like to switch from Dr. SwazilandJordan to Dr. Mariah MillingGollan. Patient saw Dr. Mariah MillingGollan back in 2012.   Would this switch be okay?

## 2017-05-20 NOTE — Telephone Encounter (Signed)
Routed Dr. Windell HummingbirdGollan's response to [facility]

## 2017-05-20 NOTE — Telephone Encounter (Signed)
Ok thx TG

## 2017-09-12 NOTE — Progress Notes (Signed)
Cardiology Office Note  Date:  09/14/2017   ID:  Kelly ClossDonald R [name], DOB [birth year], MRN [id number]  PCP:  Lauro RegulusAnderson, Marshall W, MD   Chief Complaint  Patient presents with  . other    Would like to estabish care; last seen by Dr. Mariah MillingGollan in 2012. Meds reviewed by the pt.'s daughter. "doing well." Denies chest pain or shortness of breath.     HPI:  77 year old male with past medical history of  left hip replacement,  chronic right shoulder pain with history of cortisone shot,   shortness of breath   CT Scan of his chest showed underlying coronary artery disease. 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  Moderate to severe stenosis of the left PCA superior P3 division.  CT scan chest from 2011 pulled up in the office and review today CT scan 2019 pulled up in the office and reviewed No significant change in aortic arch size 40 mm  Denies any chest pain or shortness of breath on exertion Currently lives in nursing home, walks with a walker No recent falls Plavix recently started by neurology as outpatient  He has never smoked before, does not have diabetes.   EKG personally reviewed by myself on todays visit Shows normal sinus rhythm rate 90 bpm poor R-wave progression through the anterior precordial leads, left axis deviation   PMH:   has a past medical history of Dementia, Hypertension, Hypertriglyceridemia, and Stroke (HCC).  PSH:    Past Surgical History:  Procedure Laterality Date  . HAND SURGERY    . TOTAL HIP ARTHROPLASTY      Left    Current Outpatient Medications  Medication Sig Dispense Refill  . aspirin 81 MG chewable tablet Chew 1 tablet (81 mg total) by mouth daily.    Marland Kitchen. atorvastatin (LIPITOR) 40 MG tablet Take 40 mg by mouth daily.    . clotrimazole (LOTRIMIN) 1 % cream Apply 1 application topically daily as needed.    . donepezil (ARICEPT) 10 MG tablet Take 10 mg by mouth at bedtime.    . sennosides-docusate sodium (SENOKOT-S) 8.6-50 MG tablet Take 1 tablet by mouth as needed.     . clopidogrel (PLAVIX) 75 MG tablet Take 1 tablet (75 mg total) by mouth daily. 90 tablet 3   No current facility-administered medications for this visit.      Allergies:   Patient has no known allergies.   Social History:  The patient  reports that he has never smoked. He has never used smokeless tobacco. He reports that he does not drink alcohol or use drugs.   Family History:   family history includes Heart failure in his father; Hypertension in his father.    Review of Systems: Review of Systems  Constitutional: Negative.   Respiratory: Negative.   Cardiovascular: Negative.   Gastrointestinal: Negative.   Musculoskeletal: Negative.        Gait instability  Neurological: Negative.   Psychiatric/Behavioral: Negative.   All other systems reviewed and are negative.    PHYSICAL EXAM: VS:  BP 130/80 (BP Location: Left Arm, Patient Position: Sitting, Cuff  Size: Normal)   Pulse 90   Ht 6\' 2"  (1.88 m)   Wt 255 lb 4 oz (115.8 kg)   BMI 32.77 kg/m  , BMI Body mass index is 32.77 kg/m. GEN: Well nourished, well developed, in no acute distress , presents with a walker HEENT: normal  Neck: no JVD, carotid bruits, or masses Cardiac: RRR; no murmurs, rubs, or gallops,no edema  Respiratory:  clear to auscultation bilaterally, normal work of breathing GI: soft, nontender, nondistended, + BS MS: no deformity or atrophy  Skin: warm and dry, no rash Neuro:  Strength and sensation are intact Psych: euthymic mood,  full affect, conversant but does not engage in conversation unless asked questions   Recent Labs: 04/27/2017: Hemoglobin 15.7; Platelets 183 04/28/2017: BUN 11; Creatinine, Ser 0.90; Potassium 3.8; Sodium 135    Lipid Panel Lab Results  Component Value Date   CHOL 89 04/28/2017   HDL 34 (L) 04/28/2017   LDLCALC 33 04/28/2017   TRIG 110 04/28/2017      Wt Readings from Last 3 Encounters:  09/14/17 255 lb 4 oz (115.8 kg)  04/28/17 262 lb 2 oz (118.9 kg)  06/16/16 260 lb 9.6 oz (118.2 kg)       ASSESSMENT AND PLAN:  Coronary artery disease of native artery of native heart with stable angina pectoris (HCC) - Plan: EKG 12-Lead Currently with no symptoms of angina. No further workup at this time. Continue current medication regimen.  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 No changes to the medications were made.  Shortness of breath Stable symptoms Recommended regular walking program and weight loss  Essential hypertension Blood pressure is well controlled on today's visit. No changes made to the medications.  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 EKG 12-Lead     Signed, Dossie Arbour, M.D., Ph.D. 09/14/2017  [facility] [place], [place] [phone]

## 2017-09-14 ENCOUNTER — Ambulatory Visit: Payer: Medicare Other | Admitting: Cardiovascular Disease

## 2017-09-14 ENCOUNTER — Encounter: Payer: Self-pay | Admitting: Cardiovascular Disease

## 2017-09-14 VITALS — BP 130/80 | HR 90 | Ht 74.0 in | Wt 255.2 lb

## 2017-09-14 DIAGNOSIS — R0602 Shortness of breath: Secondary | ICD-10-CM | POA: Diagnosis not present

## 2017-09-14 DIAGNOSIS — I77819 Aortic ectasia, unspecified site: Secondary | ICD-10-CM

## 2017-09-14 DIAGNOSIS — E782 Mixed hyperlipidemia: Secondary | ICD-10-CM

## 2017-09-14 DIAGNOSIS — G301 Alzheimer's disease with late onset: Secondary | ICD-10-CM

## 2017-09-14 DIAGNOSIS — I10 Essential (primary) hypertension: Secondary | ICD-10-CM

## 2017-09-14 DIAGNOSIS — F0281 Dementia in other diseases classified elsewhere with behavioral disturbance: Secondary | ICD-10-CM

## 2017-09-14 DIAGNOSIS — I63342 Cerebral infarction due to thrombosis of left cerebellar artery: Secondary | ICD-10-CM | POA: Diagnosis not present

## 2017-09-14 DIAGNOSIS — I25118 Atherosclerotic heart disease of native coronary artery with other forms of angina pectoris: Secondary | ICD-10-CM

## 2017-09-14 NOTE — Patient Instructions (Signed)

## 2018-06-23 ENCOUNTER — Encounter (HOSPITAL_COMMUNITY): Payer: Self-pay

## 2018-06-23 ENCOUNTER — Inpatient Hospital Stay (HOSPITAL_COMMUNITY)
Admission: EM | Admit: 2018-06-23 | Discharge: 2018-06-26 | DRG: 202 | Disposition: A | Discharge status: Home Health | Payer: Medicare Other | Attending: Internal Medicine | Admitting: Internal Medicine

## 2018-06-23 ENCOUNTER — Other Ambulatory Visit: Payer: Self-pay

## 2018-06-23 ENCOUNTER — Emergency Department (HOSPITAL_COMMUNITY): Payer: Medicare Other

## 2018-06-23 DIAGNOSIS — I2510 Atherosclerotic heart disease of native coronary artery without angina pectoris: Secondary | ICD-10-CM | POA: Diagnosis present

## 2018-06-23 DIAGNOSIS — G301 Alzheimer's disease with late onset: Secondary | ICD-10-CM | POA: Diagnosis present

## 2018-06-23 DIAGNOSIS — E669 Obesity, unspecified: Secondary | ICD-10-CM | POA: Diagnosis present

## 2018-06-23 DIAGNOSIS — R7303 Prediabetes: Secondary | ICD-10-CM | POA: Diagnosis present

## 2018-06-23 DIAGNOSIS — Z7982 Long term (current) use of aspirin: Secondary | ICD-10-CM

## 2018-06-23 DIAGNOSIS — R0902 Hypoxemia: Secondary | ICD-10-CM

## 2018-06-23 DIAGNOSIS — F0281 Dementia in other diseases classified elsewhere with behavioral disturbance: Secondary | ICD-10-CM | POA: Diagnosis present

## 2018-06-23 DIAGNOSIS — J069 Acute upper respiratory infection, unspecified: Secondary | ICD-10-CM

## 2018-06-23 DIAGNOSIS — Z6831 Body mass index (BMI) 31.0-31.9, adult: Secondary | ICD-10-CM

## 2018-06-23 DIAGNOSIS — R0602 Shortness of breath: Secondary | ICD-10-CM

## 2018-06-23 DIAGNOSIS — Z8673 Personal history of transient ischemic attack (TIA), and cerebral infarction without residual deficits: Secondary | ICD-10-CM

## 2018-06-23 DIAGNOSIS — R17 Unspecified jaundice: Secondary | ICD-10-CM | POA: Diagnosis present

## 2018-06-23 DIAGNOSIS — J205 Acute bronchitis due to respiratory syncytial virus: Secondary | ICD-10-CM | POA: Diagnosis not present

## 2018-06-23 DIAGNOSIS — Z8249 Family history of ischemic heart disease and other diseases of the circulatory system: Secondary | ICD-10-CM

## 2018-06-23 DIAGNOSIS — Z96642 Presence of left artificial hip joint: Secondary | ICD-10-CM | POA: Diagnosis present

## 2018-06-23 DIAGNOSIS — I10 Essential (primary) hypertension: Secondary | ICD-10-CM | POA: Diagnosis present

## 2018-06-23 DIAGNOSIS — Z7902 Long term (current) use of antithrombotics/antiplatelets: Secondary | ICD-10-CM

## 2018-06-23 DIAGNOSIS — E781 Pure hyperglyceridemia: Secondary | ICD-10-CM | POA: Diagnosis present

## 2018-06-23 DIAGNOSIS — E785 Hyperlipidemia, unspecified: Secondary | ICD-10-CM | POA: Diagnosis present

## 2018-06-23 DIAGNOSIS — J9601 Acute respiratory failure with hypoxia: Secondary | ICD-10-CM | POA: Diagnosis present

## 2018-06-23 LAB — COMPREHENSIVE METABOLIC PANEL
ALK PHOS: 80 U/L (ref 38–126)
ALT: 51 U/L — AB (ref 0–44)
AST: 48 U/L — AB (ref 15–41)
Albumin: 3.5 g/dL (ref 3.5–5.0)
Anion gap: 9 (ref 5–15)
BUN: 13 mg/dL (ref 8–23)
CALCIUM: 8.7 mg/dL — AB (ref 8.9–10.3)
CHLORIDE: 105 mmol/L (ref 98–111)
CO2: 22 mmol/L (ref 22–32)
CREATININE: 0.96 mg/dL (ref 0.61–1.24)
Glucose, Bld: 115 mg/dL — ABNORMAL HIGH (ref 70–99)
Potassium: 4.3 mmol/L (ref 3.5–5.1)
Sodium: 136 mmol/L (ref 135–145)
TOTAL PROTEIN: 7.2 g/dL (ref 6.5–8.1)
Total Bilirubin: 1.5 mg/dL — ABNORMAL HIGH (ref 0.3–1.2)

## 2018-06-23 LAB — CBC WITH DIFFERENTIAL/PLATELET
ABS IMMATURE GRANULOCYTES: 0.06 10*3/uL (ref 0.00–0.07)
BASOS PCT: 1 %
Basophils Absolute: 0.1 10*3/uL (ref 0.0–0.1)
Eosinophils Absolute: 0.2 10*3/uL (ref 0.0–0.5)
Eosinophils Relative: 1 %
HEMATOCRIT: 48.8 % (ref 39.0–52.0)
Hemoglobin: 15.9 g/dL (ref 13.0–17.0)
IMMATURE GRANULOCYTES: 1 %
LYMPHS ABS: 2.8 10*3/uL (ref 0.7–4.0)
Lymphocytes Relative: 23 %
MCH: 30.2 pg (ref 26.0–34.0)
MCHC: 32.6 g/dL (ref 30.0–36.0)
MCV: 92.6 fL (ref 80.0–100.0)
MONO ABS: 1.7 10*3/uL — AB (ref 0.1–1.0)
Monocytes Relative: 14 %
NEUTROS ABS: 7.5 10*3/uL (ref 1.7–7.7)
Neutrophils Relative %: 60 %
PLATELETS: 210 10*3/uL (ref 150–400)
RBC: 5.27 MIL/uL (ref 4.22–5.81)
RDW: 13.9 % (ref 11.5–15.5)
WBC: 12.3 10*3/uL — ABNORMAL HIGH (ref 4.0–10.5)
nRBC: 0 % (ref 0.0–0.2)

## 2018-06-23 LAB — INFLUENZA PANEL BY PCR (TYPE A & B)
INFLAPCR: NEGATIVE
INFLBPCR: NEGATIVE

## 2018-06-23 MED ORDER — ACETAMINOPHEN 325 MG PO TABS
650.0000 mg | ORAL_TABLET | Freq: Once | ORAL | Status: AC
  Administered 2018-06-23: 650 mg via ORAL
  Filled 2018-06-23: qty 2

## 2018-06-23 NOTE — ED Notes (Signed)
Pt noted to have pulled [place] from his face. Reminded of need to keep his O2 on.

## 2018-06-23 NOTE — ED Notes (Signed)
Patient transported to CT 

## 2018-06-23 NOTE — ED Notes (Signed)
Pt unable to sit up in bed under own power. Continues to be confused about date/time. Presents as lethargic. Madilyn Hook, MD aware.

## 2018-06-23 NOTE — ED Notes (Signed)
ED Provider at bedside. 

## 2018-06-23 NOTE — ED Provider Notes (Signed)
[facility] EMERGENCY DEPARTMENT Provider Note   CSN: [id number] Arrival date & time: 06/23/18  1734    History   Chief Complaint Chief Complaint  Patient presents with  . Fever  . Cough    HPI Kelly [name] is a 78 y.o. male.     The history is provided by the patient, medical records, the EMS personnel and a relative. No language interpreter was used.  Fever  Associated symptoms: cough   Cough  Associated symptoms: fever    Kelly [name] is a 78 y.o. male who presents to the Emergency Department complaining of fever/cough. Five caveat due to dementia. History is provided by the patient and EMS. He presents to the emergency department by EMS from [facility] for evaluation of two days of fever, cough and shortness of breath. He reports associated generalized weakness. He denies any chest pain, abdominal pain, nausea, vomiting. Symptoms are moderate, constant, worsening.  Additional history available from the daughter after patient's initial ED arrival. She reports that he has experienced increasing cough and fever for the last two days. She states that his voice sounds different. No known sick contacts. Past Medical History:  Diagnosis Date  . Dementia (HCC)   . Hypertension   . Hypertriglyceridemia   . Stroke [facility])     Patient Active Problem List   Diagnosis Date Noted  . Acute respiratory failure with hypoxia (HCC) 06/23/2018  . 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 Delirium   . Late onset Alzheimer's disease with behavioral disturbance (HCC)   . Fall   . Essential hypertension   . TIA (transient ischemic attack) 04/27/2017  . CVA (cerebral vascular accident) (HCC) 04/27/2017  . 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 CAD (coronary artery disease) 10/26/2010  . Hyperlipidemia 10/26/2010  . SHORTNESS OF BREATH 07/18/2009    Past Surgical History:  Procedure Laterality Date  . HAND SURGERY    . TOTAL HIP ARTHROPLASTY     Left        Home Medications    Prior to Admission medications   Medication Sig Start Date End Date Taking? Authorizing Provider  aspirin 81 MG chewable tablet Chew 1 tablet (81 mg total) by mouth daily. 09/14/17   Antonieta Iba, MD  atorvastatin (LIPITOR) 40 MG tablet Take 40 mg by mouth daily. 05/03/16 09/14/17  [provider]  clopidogrel (PLAVIX) 75 MG tablet Take 1 tablet (75 mg total) by mouth daily. 09/14/17   Antonieta Iba, MD  clotrimazole (LOTRIMIN) 1 % cream Apply 1 application topically daily as needed.    [provider]  donepezil (ARICEPT) 10 MG tablet Take 10 mg by mouth at bedtime. 03/15/17   [provider]  sennosides-docusate sodium (SENOKOT-S) 8.6-50 MG tablet Take 1 tablet by mouth as needed.     [provider]    Family History Family History  Problem Relation Age of Onset  . Heart failure Father   . Hypertension Father     Social History Social History   Tobacco Use  . Smoking status: Never Smoker  . Smokeless tobacco: Never Used  Substance Use Topics  . Alcohol use: No  . Drug use: No     Allergies   Patient has no known allergies.   Review of Systems Review of Systems  Constitutional: Positive for fever.  Respiratory: Positive for cough.   All other systems reviewed and are negative.    Physical Exam Updated Vital Signs BP 135/80  Pulse 80   Temp (S) (!) 100.4 F (38 C) (Axillary)   Resp 18   Ht 6' 2.5" (1.892 m)   Wt 113.4 kg   SpO2 93%   BMI 31.67 kg/m   Physical Exam Vitals signs and nursing note reviewed.  Constitutional:      Appearance: He is well-developed.  HENT:     Head: Normocephalic and atraumatic.  Eyes:     Comments: Small amount of green discharge  Cardiovascular:     Rate and Rhythm: Normal rate and regular rhythm.     Heart sounds: No murmur.  Pulmonary:     Effort: No respiratory distress.     Comments: Transmitted upper airway noises throughout all lung fields.    Abdominal:     Palpations: Abdomen is soft.     Tenderness: There is no abdominal tenderness. There is no guarding or rebound.  Musculoskeletal:        General: No tenderness.  Skin:    General: Skin is warm and dry.  Neurological:     Mental Status: He is alert.     Comments: Generalized weakness. Moderate confusion. Oriented to place and person, disoriented to time  Psychiatric:        Mood and Affect: Mood normal.        Behavior: Behavior normal.      ED Treatments / Results  Labs (all labs ordered are listed, but only abnormal results are displayed) Labs Reviewed  CBC WITH DIFFERENTIAL/PLATELET - Abnormal; Notable for the following components:      Result Value   WBC 12.3 (*)    Monocytes Absolute 1.7 (*)    All other components within normal limits  COMPREHENSIVE METABOLIC PANEL - Abnormal; Notable for the following components:   Glucose, Bld 115 (*)    Calcium 8.7 (*)    AST 48 (*)    ALT 51 (*)    Total Bilirubin 1.5 (*)    All other components within normal limits  URINE CULTURE  INFLUENZA PANEL BY PCR (TYPE A & B)  URINALYSIS, ROUTINE W REFLEX MICROSCOPIC    EKG EKG Interpretation  Date/Time:  Friday June 23 2018 17:47:43 EDT Ventricular Rate:  88 PR Interval:    QRS Duration: 125 QT Interval:  387 QTC Calculation: 469 R Axis:   -82 Text Interpretation:  Sinus rhythm Multiple ventricular premature complexes Nonspecific IVCD with LAD No significant change since last tracing Confirmed by Tilden Fossa [phone]) on 06/23/2018 5:52:05 PM   Radiology Ct Chest Wo Contrast  Result Date: 06/23/2018 CLINICAL DATA:  Nonproductive cough for 2 days. Low grade fever. EXAM: CT CHEST WITHOUT CONTRAST TECHNIQUE: Multidetector CT imaging of the chest was performed following the standard protocol without IV contrast. COMPARISON:  08/26/2009 FINDINGS: Cardiovascular: Normal heart size. No pericardial effusions. Coronary artery calcifications. Normal caliber thoracic  aorta with scattered calcification. Mediastinum/Nodes: Scattered mediastinal lymph nodes are not pathologically enlarged and are unchanged since prior study, likely reactive. Esophagus is decompressed. Lungs/Pleura: Mild dependent changes in the lung bases. No airspace disease or consolidation. No pleural effusions. No pneumothorax. Airways are patent. Upper Abdomen: No acute abnormality demonstrated. Musculoskeletal: Degenerative changes in the spine. No destructive bone lesions. IMPRESSION: No evidence of active pulmonary disease. Electronically Signed   By: Burman Nieves M.D.   On: 06/23/2018 22:21   Dg Chest Port 1 View  Result Date: 06/23/2018 CLINICAL DATA:  Cough EXAM: PORTABLE CHEST 1 VIEW COMPARISON:  03/03/2016 FINDINGS: Mild cardiomegaly with pulmonary vascular congestion.  No focal airspace consolidation or overt pulmonary edema. No pleural effusion or pneumothorax. IMPRESSION: Cardiomegaly and pulmonary vascular congestion without overt pulmonary edema. Electronically Signed   By: Deatra Robinson M.D.   On: 06/23/2018 18:09    Procedures Procedures (including critical care time)  Medications Ordered in ED Medications  acetaminophen (TYLENOL) tablet 650 mg (650 mg Oral Given 06/23/18 1859)     Initial Impression / Assessment and Plan / ED Course  I have reviewed the triage vital signs and the nursing notes.  Pertinent labs & imaging results that were available during my care of the patient were reviewed by me and considered in my medical decision making (see chart for details).        Patient here for evaluation of fever, cough and generalized weakness for the last two days. He is generally week on examination without focal findings. Chest x-ray is negative for pneumonia. CT scan obtained to look for developing pneumonia, which is also negative for pneumonia. He does have increased oxygen requirement, sources unclear. Flu is negative. Plan to admit for further evaluation and  management. Patient and family updated findings of studies and recommendation for admission and they are in agreement with treatment plan. Hospitalist consulted for admission.  Final Clinical Impressions(s) / ED Diagnoses   Final diagnoses:  Viral upper respiratory tract infection  Hypoxia    ED Discharge Orders    None       Tilden Fossa, MD 06/23/18 2301

## 2018-06-23 NOTE — ED Notes (Signed)
ED TO INPATIENT HANDOFF REPORT  ED Nurse Name and Phone #:  Marquita Palms  [phone]  S Name/Age/Gender Kelly [name] 78 y.o. male Room/Bed: 041C/041C  Code Status   Code Status: Prior  Home/SNF/Other Skilled nursing facility Patient oriented to: self, place and basic situation Is this baseline? Yes   Triage Complete: Triage complete  Chief Complaint SNF,Cough,Fever   Triage Note Pt from [facility] via ems; called out for non productive cough x 2 days and low grade fever (99.2); A and O x 4  154/88 HR 89 95% RA CBG 128   Allergies No Known Allergies  Level of Care/Admitting Diagnosis ED Disposition    ED Disposition Condition Comment   Admit  Hospital Area: [facility] [[id number]]  Level of Care: Telemetry Medical [104]  I expect the patient will be discharged within 24 hours: No (not a candidate for 5C-Observation unit)  Diagnosis: Acute respiratory failure with hypoxia [facility]) [[id number]]  Admitting Physician: Eduard Clos [phone]  Attending Physician: Eduard Clos Florian.Pax  PT Class (Do Not Modify): Observation [104]  PT Acc Code (Do Not Modify): Observation [10022]       B Medical/Surgery History Past Medical History:  Diagnosis Date  . Dementia (HCC)   . Hypertension   . Hypertriglyceridemia   . Stroke [facility])    Past Surgical History:  Procedure Laterality Date  . HAND SURGERY    . TOTAL HIP ARTHROPLASTY     Left     A IV Location/Drains/Wounds Patient Lines/Drains/Airways Status   Active Line/Drains/Airways    Name:   Placement date:   Placement time:   Site:   Days:   Peripheral IV 06/23/18 Left Forearm   06/23/18    -    Forearm   less than 1          Intake/Output Last 24 hours No intake or output data in the 24 hours ending 06/23/18 2307  Labs/Imaging Results for orders placed or performed during the hospital encounter of 06/23/18 (from the past 48 hour(s))  CBC with Differential     Status: Abnormal   Collection Time: 06/23/18  5:51 PM  Result Value Ref Range   WBC 12.3 (H) 4.0 - 10.5 K/uL   RBC 5.27 4.22 - 5.81 MIL/uL   Hemoglobin 15.9 13.0 - 17.0 g/dL   HCT 93.1 12.1 - 62.4 %   MCV 92.6 80.0 - 100.0 fL   MCH 30.2 26.0 - 34.0 pg   MCHC 32.6 30.0 - 36.0 g/dL   RDW 46.9 50.7 - 22.5 %   Platelets 210 150 - 400 K/uL   nRBC 0.0 0.0 - 0.2 %   Neutrophils Relative % 60 %   Neutro Abs 7.5 1.7 - 7.7 K/uL   Lymphocytes Relative 23 %   Lymphs Abs 2.8 0.7 - 4.0 K/uL   Monocytes Relative 14 %   Monocytes Absolute 1.7 (H) 0.1 - 1.0 K/uL   Eosinophils Relative 1 %   Eosinophils Absolute 0.2 0.0 - 0.5 K/uL   Basophils Relative 1 %   Basophils Absolute 0.1 0.0 - 0.1 K/uL   Immature Granulocytes 1 %   Abs Immature Granulocytes 0.06 0.00 - 0.07 K/uL    Comment: Performed at [facility], [address], [place], [place] [address]  Comprehensive metabolic panel     Status: Abnormal   Collection Time: 06/23/18  5:51 PM  Result Value Ref Range   Sodium 136 135 - 145 mmol/L   Potassium  4.3 3.5 - 5.1 mmol/L   Chloride 105 98 - 111 mmol/L   CO2 22 22 - 32 mmol/L   Glucose, Bld 115 (H) 70 - 99 mg/dL   BUN 13 8 - 23 mg/dL   Creatinine, Ser 9.51 0.61 - 1.24 mg/dL   Calcium 8.7 (L) 8.9 - 10.3 mg/dL   Total Protein 7.2 6.5 - 8.1 g/dL   Albumin 3.5 3.5 - 5.0 g/dL   AST 48 (H) 15 - 41 U/L   ALT 51 (H) 0 - 44 U/L   Alkaline Phosphatase 80 38 - 126 U/L   Total Bilirubin 1.5 (H) 0.3 - 1.2 mg/dL   GFR calc non Af Amer >60 >60 mL/min   GFR calc Af Amer >60 >60 mL/min   Anion gap 9 5 - 15    Comment: Performed at [facility], [address] [place], [place] [address]  Influenza panel by PCR (type A & B)     Status: None   Collection Time: 06/23/18  6:40 PM  Result Value Ref Range   Influenza A By PCR NEGATIVE NEGATIVE   Influenza B By PCR NEGATIVE NEGATIVE    Comment: (NOTE) The Xpert Xpress Flu assay is intended as an aid in the diagnosis of  influenza and should not be used as a  sole basis for treatment.  This  assay is FDA approved for nasopharyngeal swab specimens only. Nasal  washings and aspirates are unacceptable for Xpert Xpress Flu testing. Performed at [facility], [address] [place], [place] [address]    Ct Chest Wo Contrast  Result Date: 06/23/2018 CLINICAL DATA:  Nonproductive cough for 2 days. Low grade fever. EXAM: CT CHEST WITHOUT CONTRAST TECHNIQUE: Multidetector CT imaging of the chest was performed following the standard protocol without IV contrast. COMPARISON:  08/26/2009 FINDINGS: Cardiovascular: Normal heart size. No pericardial effusions. Coronary artery calcifications. Normal caliber thoracic aorta with scattered calcification. Mediastinum/Nodes: Scattered mediastinal lymph nodes are not pathologically enlarged and are unchanged since prior study, likely reactive. Esophagus is decompressed. Lungs/Pleura: Mild dependent changes in the lung bases. No airspace disease or consolidation. No pleural effusions. No pneumothorax. Airways are patent. Upper Abdomen: No acute abnormality demonstrated. Musculoskeletal: Degenerative changes in the spine. No destructive bone lesions. IMPRESSION: No evidence of active pulmonary disease. Electronically Signed   By: Burman Nieves M.D.   On: 06/23/2018 22:21   Dg Chest Port 1 View  Result Date: 06/23/2018 CLINICAL DATA:  Cough EXAM: PORTABLE CHEST 1 VIEW COMPARISON:  03/03/2016 FINDINGS: Mild cardiomegaly with pulmonary vascular congestion. No focal airspace consolidation or overt pulmonary edema. No pleural effusion or pneumothorax. IMPRESSION: Cardiomegaly and pulmonary vascular congestion without overt pulmonary edema. Electronically Signed   By: Deatra Robinson M.D.   On: 06/23/2018 18:09    Pending Labs Unresulted Labs (From admission, onward)    Start     Ordered   06/23/18 2302  Respiratory Panel by PCR  (Respiratory virus panel with precautions)  Once,   R     06/23/18 2301   06/23/18 2248   Urinalysis, Routine w reflex microscopic  ONCE - STAT,   STAT     06/23/18 2247   06/23/18 2248  Urine culture  ONCE - STAT,   STAT     06/23/18 2247          Vitals/Pain Today's Vitals   06/23/18 2200 06/23/18 2230 06/23/18 2246 06/23/18 2303  BP: 135/80     Pulse: 83 80 76  79  Resp: (!) Temp:    98.2 F (36.8 C)  TempSrc:    Oral  SpO2: 94% 93% 93% 93%  Weight:      Height:      PainSc:        Isolation Precautions Droplet precaution  Medications Medications  acetaminophen (TYLENOL) tablet 650 mg (650 mg Oral Given 06/23/18 1859)    Mobility walks with device     Focused Assessments Cardiac Assessment Handoff:  Cardiac Rhythm: Normal sinus rhythm Lab Results  Component Value Date   CKTOTAL 585 (H) 03/05/2016   TROPONINI <0.03 04/27/2017   No results found for: DDIMER Does the Patient currently have chest pain? No  , Pulmonary Assessment Handoff:  Lung sounds: Bilateral Breath Sounds: Diminished L Breath Sounds: Diminished R Breath Sounds: Diminished O2 Device: Nasal Cannula O2 Flow Rate (L/min): 3 L/min      R Recommendations: See Admitting Provider Note  Report given to:   Additional Notes:  Pt SpO2 in mid to high 80s on RA. Placed on 3Lpm via [place]. Pt sometimes pulls [place] off and has to be reminded to keep it on. Oriented to person, place, and basic situation but has difficulty getting month or day correct. Understand this may be baseline. Pt told this RN that he walks with a walker at facility, but had trouble pulling himself to a sitting position in bed, so have not attempted to ambulate since arrival. Flu NEGATIVE, but had lab add on respiratory panel, so pt remains on droplet precautions. Family at bedside.

## 2018-06-23 NOTE — ED Triage Notes (Signed)
Pt from [facility] via ems; called out for non productive cough x 2 days and low grade fever (99.2); A and O x 4  154/88 HR 89 95% RA CBG 128

## 2018-06-24 ENCOUNTER — Encounter (HOSPITAL_COMMUNITY): Payer: Self-pay | Admitting: Internal Medicine

## 2018-06-24 ENCOUNTER — Observation Stay (HOSPITAL_COMMUNITY): Payer: Medicare Other

## 2018-06-24 DIAGNOSIS — I10 Essential (primary) hypertension: Secondary | ICD-10-CM | POA: Diagnosis not present

## 2018-06-24 DIAGNOSIS — J9601 Acute respiratory failure with hypoxia: Secondary | ICD-10-CM | POA: Diagnosis not present

## 2018-06-24 DIAGNOSIS — E782 Mixed hyperlipidemia: Secondary | ICD-10-CM

## 2018-06-24 DIAGNOSIS — R945 Abnormal results of liver function studies: Secondary | ICD-10-CM

## 2018-06-24 DIAGNOSIS — B974 Respiratory syncytial virus as the cause of diseases classified elsewhere: Secondary | ICD-10-CM

## 2018-06-24 LAB — RESPIRATORY PANEL BY PCR
Adenovirus: NOT DETECTED
BORDETELLA PERTUSSIS-RVPCR: NOT DETECTED
Chlamydophila pneumoniae: NOT DETECTED
Coronavirus 229E: NOT DETECTED
Coronavirus HKU1: NOT DETECTED
Coronavirus NL63: NOT DETECTED
Coronavirus OC43: NOT DETECTED
INFLUENZA A-RVPPCR: NOT DETECTED
Influenza B: NOT DETECTED
Metapneumovirus: NOT DETECTED
Mycoplasma pneumoniae: NOT DETECTED
PARAINFLUENZA VIRUS 3-RVPPCR: NOT DETECTED
Parainfluenza Virus 1: NOT DETECTED
Parainfluenza Virus 2: NOT DETECTED
Parainfluenza Virus 4: NOT DETECTED
RHINOVIRUS / ENTEROVIRUS - RVPPCR: NOT DETECTED
Respiratory Syncytial Virus: DETECTED — AB

## 2018-06-24 LAB — BASIC METABOLIC PANEL
ANION GAP: 9 (ref 5–15)
BUN: 13 mg/dL (ref 8–23)
CALCIUM: 8.6 mg/dL — AB (ref 8.9–10.3)
CO2: 21 mmol/L — ABNORMAL LOW (ref 22–32)
Chloride: 106 mmol/L (ref 98–111)
Creatinine, Ser: 0.95 mg/dL (ref 0.61–1.24)
GFR calc Af Amer: >60 mL/min (ref >60)
GFR calc non Af Amer: >60 mL/min (ref >60)
Glucose, Bld: 150 mg/dL — ABNORMAL HIGH (ref 70–99)
Potassium: 3.8 mmol/L (ref 3.5–5.1)
Sodium: 136 mmol/L (ref 135–145)

## 2018-06-24 LAB — HEPATIC FUNCTION PANEL
ALT: 39 U/L (ref 0–44)
AST: 31 U/L (ref 15–41)
Albumin: 3.2 g/dL — ABNORMAL LOW (ref 3.5–5.0)
Alkaline Phosphatase: 75 U/L (ref 38–126)
Bilirubin, Direct: 0.2 mg/dL (ref 0.0–0.2)
Indirect Bilirubin: 0.8 mg/dL (ref 0.3–0.9)
Total Bilirubin: 1 mg/dL (ref 0.3–1.2)
Total Protein: 6.6 g/dL (ref 6.5–8.1)

## 2018-06-24 LAB — CBC
HCT: 45.8 % (ref 39.0–52.0)
Hemoglobin: 15.7 g/dL (ref 13.0–17.0)
MCH: 31.4 pg (ref 26.0–34.0)
MCHC: 34.3 g/dL (ref 30.0–36.0)
MCV: 91.6 fL (ref 80.0–100.0)
Platelets: 166 10*3/uL (ref 150–400)
RBC: 5 MIL/uL (ref 4.22–5.81)
RDW: 14.1 % (ref 11.5–15.5)
WBC: 11.1 10*3/uL — ABNORMAL HIGH (ref 4.0–10.5)
nRBC: 0 % (ref 0.0–0.2)

## 2018-06-24 LAB — TROPONIN I
Troponin I: 0.03 ng/mL (ref <0.03)
Troponin I: <0.03 ng/mL (ref <0.03)
Troponin I: <0.03 ng/mL (ref <0.03)

## 2018-06-24 LAB — BLOOD GAS, ARTERIAL
Acid-base deficit: 0.7 mmol/L (ref 0.0–2.0)
Bicarbonate: 23 mmol/L (ref 20.0–28.0)
Drawn by: 36529
O2 Content: 3 L/min
O2 Saturation: 96.3 %
Patient temperature: 98.6
pCO2 arterial: 35.2 mmHg (ref 32.0–48.0)
pH, Arterial: 7.431 (ref 7.350–7.450)
pO2, Arterial: 79.9 mmHg — ABNORMAL LOW (ref 83.0–108.0)

## 2018-06-24 LAB — MRSA PCR SCREENING: MRSA by PCR: NEGATIVE

## 2018-06-24 MED ORDER — DONEPEZIL HCL 10 MG PO TABS
10.0000 mg | ORAL_TABLET | Freq: Every day | ORAL | Status: DC
  Administered 2018-06-24 – 2018-06-25 (×3): 10 mg via ORAL
  Filled 2018-06-24 (×3): qty 1

## 2018-06-24 MED ORDER — ENOXAPARIN SODIUM 40 MG/0.4ML ~~LOC~~ SOLN
40.0000 mg | SUBCUTANEOUS | Status: DC
  Administered 2018-06-24: 40 mg via SUBCUTANEOUS
  Filled 2018-06-24 (×2): qty 0.4

## 2018-06-24 MED ORDER — SENNOSIDES-DOCUSATE SODIUM 8.6-50 MG PO TABS: 1.0000 | ORAL_TABLET | ORAL | Status: DC | PRN

## 2018-06-24 MED ORDER — BUDESONIDE 0.25 MG/2ML IN SUSP
0.2500 mg | Freq: Two times a day (BID) | RESPIRATORY_TRACT | Status: DC
  Administered 2018-06-24 – 2018-06-26 (×5): 0.25 mg via RESPIRATORY_TRACT
  Filled 2018-06-24 (×5): qty 2

## 2018-06-24 MED ORDER — ALBUTEROL SULFATE (2.5 MG/3ML) 0.083% IN NEBU
2.5000 mg | INHALATION_SOLUTION | RESPIRATORY_TRACT | Status: DC
  Filled 2018-06-24: qty 3

## 2018-06-24 MED ORDER — ONDANSETRON HCL 4 MG/2ML IJ SOLN: 4.0000 mg | Freq: Four times a day (QID) | INTRAMUSCULAR | Status: DC | PRN

## 2018-06-24 MED ORDER — ONDANSETRON HCL 4 MG PO TABS: 4.0000 mg | ORAL_TABLET | Freq: Four times a day (QID) | ORAL | Status: DC | PRN

## 2018-06-24 MED ORDER — ALBUTEROL SULFATE (2.5 MG/3ML) 0.083% IN NEBU: 2.5000 mg | INHALATION_SOLUTION | RESPIRATORY_TRACT | Status: DC | PRN

## 2018-06-24 MED ORDER — CLOPIDOGREL BISULFATE 75 MG PO TABS
75.0000 mg | ORAL_TABLET | Freq: Every day | ORAL | Status: DC
  Administered 2018-06-24 – 2018-06-26 (×3): 75 mg via ORAL
  Filled 2018-06-24 (×3): qty 1

## 2018-06-24 MED ORDER — METHYLPREDNISOLONE SODIUM SUCC 40 MG IJ SOLR
40.0000 mg | Freq: Every day | INTRAMUSCULAR | Status: DC
  Administered 2018-06-24 – 2018-06-25 (×2): 40 mg via INTRAVENOUS
  Filled 2018-06-24 (×3): qty 1

## 2018-06-24 MED ORDER — ATORVASTATIN CALCIUM 40 MG PO TABS
40.0000 mg | ORAL_TABLET | Freq: Every day | ORAL | Status: DC
  Administered 2018-06-24 – 2018-06-26 (×3): 40 mg via ORAL
  Filled 2018-06-24 (×3): qty 1

## 2018-06-24 MED ORDER — PANTOPRAZOLE SODIUM 40 MG PO TBEC
40.0000 mg | DELAYED_RELEASE_TABLET | Freq: Every day | ORAL | Status: DC
  Administered 2018-06-24 – 2018-06-26 (×3): 40 mg via ORAL
  Filled 2018-06-24 (×3): qty 1

## 2018-06-24 MED ORDER — ASPIRIN 81 MG PO CHEW
81.0000 mg | CHEWABLE_TABLET | Freq: Every day | ORAL | Status: DC
  Administered 2018-06-24 – 2018-06-26 (×3): 81 mg via ORAL
  Filled 2018-06-24 (×4): qty 1

## 2018-06-24 MED ORDER — ACETAMINOPHEN 325 MG PO TABS: 650.0000 mg | ORAL_TABLET | Freq: Four times a day (QID) | ORAL | Status: DC | PRN

## 2018-06-24 MED ORDER — ALBUTEROL SULFATE (2.5 MG/3ML) 0.083% IN NEBU
2.5000 mg | INHALATION_SOLUTION | Freq: Three times a day (TID) | RESPIRATORY_TRACT | Status: DC
  Administered 2018-06-24 – 2018-06-25 (×4): 2.5 mg via RESPIRATORY_TRACT
  Filled 2018-06-24 (×4): qty 3

## 2018-06-24 MED ORDER — ACETAMINOPHEN 650 MG RE SUPP: 650.0000 mg | Freq: Four times a day (QID) | RECTAL | Status: DC | PRN

## 2018-06-24 NOTE — Progress Notes (Signed)
CRITICAL VALUE ALERT  Critical Value:  Troponin 0.03  Date & Time Notied:  06/24/18 0240   Provider Notified: 06/24/18 0240  Orders Received/Actions taken: n/a

## 2018-06-24 NOTE — H&P (Signed)
History and Physical    Kelly [name]:[id number] DOB: June 02, 1940 DOA: 06/23/2018  PCP: Lauro Regulus, MD  Patient coming from: Assisted living facility.  Chief Complaint: Shortness of breath cough.  HPI: Kelly [name] is a 78 y.o. male with history of dementia was brought to the ER after patient has been having cough and shortness of breath for last 2 days.  Patient denies any chest pain.  Patient has dementia and does not contribute much to history but states that he has been short of breath with coughing.  Has not complained of any abdominal pain and has no reports of nausea vomiting or diarrhea.  ED Course: In the ER patient was febrile with temperature 102 F tachycardic and lab work show mild leukocytosis.  CT chest does not show anything acute.  Patient was hypoxic and wheezing in the ER.  Influenza panel was negative.  Blood cultures and respiratory viral panel has been sent admitted for acute respiratory failure with hypoxia likely from bronchitis.  Review of Systems: As per HPI, rest all negative.   Past Medical History:  Diagnosis Date  . Dementia (HCC)   . Hypertension   . Hypertriglyceridemia   . Stroke [facility])     Past Surgical History:  Procedure Laterality Date  . HAND SURGERY    . TOTAL HIP ARTHROPLASTY     Left     reports that he has never smoked. He has never used smokeless tobacco. He reports that he does not drink alcohol or use drugs.  No Known Allergies  Family History  Problem Relation Age of Onset  . Heart failure Father   . Hypertension Father     Prior to Admission medications   Medication Sig Start Date End Date Taking? Authorizing Provider  aspirin 81 MG chewable tablet Chew 1 tablet (81 mg total) by mouth daily. 09/14/17   Antonieta Iba, MD  atorvastatin (LIPITOR) 40 MG tablet Take 40 mg by mouth daily. 05/03/16 09/14/17  [provider]  clopidogrel (PLAVIX) 75 MG tablet Take 1 tablet (75 mg total) by mouth daily.  09/14/17   Antonieta Iba, MD  clotrimazole (LOTRIMIN) 1 % cream Apply 1 application topically daily as needed.    [provider]  donepezil (ARICEPT) 10 MG tablet Take 10 mg by mouth at bedtime. 03/15/17   [provider]  sennosides-docusate sodium (SENOKOT-S) 8.6-50 MG tablet Take 1 tablet by mouth as needed.     [provider]    Physical Exam: Vitals:   06/23/18 2303 06/23/18 2315 06/24/18 0000 06/24/18 0014  BP:    (!) 146/83  Pulse: 79 78  99  Resp: 19 (!) 21    Temp: 98.2 F (36.8 C)   98.3 F (36.8 C)  TempSrc: Oral   Oral  SpO2: 93% 94%  (!) 87%  Weight:   113 kg   Height:          Constitutional: Moderately built and nourished. Vitals:   06/23/18 2303 06/23/18 2315 06/24/18 0000 06/24/18 0014  BP:    (!) 146/83  Pulse: 79 78  99  Resp: 19 (!) 21    Temp: 98.2 F (36.8 C)   98.3 F (36.8 C)  TempSrc: Oral   Oral  SpO2: 93% 94%  (!) 87%  Weight:   113 kg   Height:       Eyes: Anicteric no pallor. ENMT: No discharge from the ears eyes nose and mouth. Neck: No mass  felt.  No neck rigidity.  No JVD appreciated. Respiratory: Bilateral expiratory wheeze and no crepitations. Cardiovascular: S1-S2 heard. Abdomen: Nontender bowel sounds present.  No guarding or rigidity. Musculoskeletal: No edema.  No joint effusion. Skin: No rash. Neurologic: Alert awake oriented to his name.  Moves all extremities. Psychiatric: Oriented to his name.   Labs on Admission: I have personally reviewed following labs and imaging studies  CBC: Recent Labs  Lab 06/23/18 1751  WBC 12.3*  NEUTROABS 7.5  HGB 15.9  HCT 48.8  MCV 92.6  PLT 210   Basic Metabolic Panel: Recent Labs  Lab 06/23/18 1751  NA 136  K 4.3  CL 105  CO2 22  GLUCOSE 115*  BUN 13  CREATININE 0.96  CALCIUM 8.7*   GFR: Estimated Creatinine Clearance: 82.6 mL/min (by C-G formula based on SCr of 0.96 mg/dL). Liver Function Tests: Recent Labs  Lab 06/23/18 1751  AST  48*  ALT 51*  ALKPHOS 80  BILITOT 1.5*  PROT 7.2  ALBUMIN 3.5   No results for input(s): LIPASE, AMYLASE in the last 168 hours. No results for input(s): AMMONIA in the last 168 hours. Coagulation Profile: No results for input(s): INR, PROTIME in the last 168 hours. Cardiac Enzymes: No results for input(s): CKTOTAL, CKMB, CKMBINDEX, TROPONINI in the last 168 hours. BNP (last 3 results) No results for input(s): PROBNP in the last 8760 hours. HbA1C: No results for input(s): HGBA1C in the last 72 hours. CBG: No results for input(s): GLUCAP in the last 168 hours. Lipid Profile: No results for input(s): CHOL, HDL, LDLCALC, TRIG, CHOLHDL, LDLDIRECT in the last 72 hours. Thyroid Function Tests: No results for input(s): TSH, T4TOTAL, FREET4, T3FREE, THYROIDAB in the last 72 hours. Anemia Panel: No results for input(s): VITAMINB12, FOLATE, FERRITIN, TIBC, IRON, RETICCTPCT in the last 72 hours. Urine analysis:    Component Value Date/Time   COLORURINE YELLOW 04/27/2017 1900   APPEARANCEUR CLEAR 04/27/2017 1900   LABSPEC 1.018 04/27/2017 1900   PHURINE 6.0 04/27/2017 1900   GLUCOSEU NEGATIVE 04/27/2017 1900   HGBUR NEGATIVE 04/27/2017 1900   BILIRUBINUR NEGATIVE 04/27/2017 1900   KETONESUR NEGATIVE 04/27/2017 1900   PROTEINUR NEGATIVE 04/27/2017 1900   NITRITE NEGATIVE 04/27/2017 1900   LEUKOCYTESUR NEGATIVE 04/27/2017 1900   Sepsis Labs: @LABRCNTIP (procalcitonin:4,lacticidven:4) )No results found for this or any previous visit (from the past 240 hour(s)).   Radiological Exams on Admission: Ct Chest Wo Contrast  Result Date: 06/23/2018 CLINICAL DATA:  Nonproductive cough for 2 days. Low grade fever. EXAM: CT CHEST WITHOUT CONTRAST TECHNIQUE: Multidetector CT imaging of the chest was performed following the standard protocol without IV contrast. COMPARISON:  08/26/2009 FINDINGS: Cardiovascular: Normal heart size. No pericardial effusions. Coronary artery calcifications. Normal  caliber thoracic aorta with scattered calcification. Mediastinum/Nodes: Scattered mediastinal lymph nodes are not pathologically enlarged and are unchanged since prior study, likely reactive. Esophagus is decompressed. Lungs/Pleura: Mild dependent changes in the lung bases. No airspace disease or consolidation. No pleural effusions. No pneumothorax. Airways are patent. Upper Abdomen: No acute abnormality demonstrated. Musculoskeletal: Degenerative changes in the spine. No destructive bone lesions. IMPRESSION: No evidence of active pulmonary disease. Electronically Signed   By: Burman Nieves M.D.   On: 06/23/2018 22:21   Dg Chest Port 1 View  Result Date: 06/23/2018 CLINICAL DATA:  Cough EXAM: PORTABLE CHEST 1 VIEW COMPARISON:  03/03/2016 FINDINGS: Mild cardiomegaly with pulmonary vascular congestion. No focal airspace consolidation or overt pulmonary edema. No pleural effusion or pneumothorax. IMPRESSION: Cardiomegaly and pulmonary vascular  congestion without overt pulmonary edema. Electronically Signed   By: Deatra Robinson M.D.   On: 06/23/2018 18:09    EKG: Independently reviewed.  Normal sinus rhythm PVCs.  Assessment/Plan Principal Problem:   Acute respiratory failure with hypoxia (HCC) Active Problems:   CAD (coronary artery disease)   Hyperlipidemia   Essential hypertension    1. Acute respiratory failure with hypoxia likely from acute bronchitis suspect viral source.  Since patient is hypoxic patient has been admitted for observation.  Blood cultures sputum cultures respiratory viral panel urine for Legionella strep antigen has been ordered.  For now patient is on albuterol nebulizer Pulmicort and IV steroids.  UA is pending. 2. CAD CAT scan previously.  Denies any chest pain.  On aspirin Plavix and statins and beta-blockers. 3. History of stroke on antiplatelet agents and statins. 4. Dementia.  On Aricept. 5. Mildly elevated LFTs follow LFTs.   DVT prophylaxis: Lovenox. Code  Status: Full code. Family Communication: No family at the bedside. Disposition Plan: Back to assisted living facility when stable. Consults called: None. Admission status: Observation.   Eduard Clos MD [facility] Pager [phone].  If 7PM-7AM, please contact night-coverage [email or web address] Password [email or web address]  06/24/2018, 1:08 AM

## 2018-06-24 NOTE — Evaluation (Signed)
Clinical/Bedside Swallow Evaluation Patient Details  Name: Kelly [name] MRN: [id number] Date of Birth: 09/15/1940  Today's Date: 06/24/2018 Time: SLP Start Time (ACUTE ONLY): 1010 SLP Stop Time (ACUTE ONLY): 1025 SLP Time Calculation (min) (ACUTE ONLY): 15 min  Past Medical History:  Past Medical History:  Diagnosis Date  . Dementia (HCC)   . Hypertension   . Hypertriglyceridemia   . Stroke [facility])    Past Surgical History:  Past Surgical History:  Procedure Laterality Date  . HAND SURGERY    . TOTAL HIP ARTHROPLASTY     Left   HPI:  Kelly [name] is a 78 y.o. male with history of dementia admitted with acute respiratory failure with hypoxia likely from acute bronchitis (suspected viral source). Febrile; CT chest without acute findings. Seen by SLP for bedside swallow 03/05/2016; mechanical soft/regular and thin liquids was recommended.   Assessment / Plan / Recommendation Clinical Impression   Patient had one instance of suspected airway compromise with initial cup sip of thin liquids, due to poor awareness/labial seal with premature spillage suspected. As SLP continued assessment and pt became more alert, no further overt signs of aspiration even when pt challenged with consecutive straw sips. Coordination appears better with straw vs cup.  Pt appears to have adequate airway protection with thin liquids, purees and regular solids. Note pt was somnolent earlier this morning; head CT revealed no acute findings. Mentation likely to contribute to swallow function, and pt at mild risk due to cognitive impairments, lethargy. He did very well when he was alert. He was able to self-feed but requested assistance with spoon after a few bites. May continue current diet, with meds whole with liquid. Spoke with RN re: monitoring for alertness and to hold trays if pt lethargic. SLP to follow up briefly for tolerance with a meal.    SLP Visit Diagnosis: Dysphagia, unspecified (R13.10)     Aspiration Risk  Mild aspiration risk    Diet Recommendation Regular;Thin liquid   Medication Administration: Whole meds with liquid Supervision: Intermittent supervision to cue for compensatory strategies Compensations: Slow rate;Small sips/bites    Other  Recommendations Oral Care Recommendations: Oral care BID   Follow up Recommendations Other (comment)(tba)      Frequency and Duration min 1 x/week  1 week       Prognosis Prognosis for Safe Diet Advancement: Good Barriers to Reach Goals: Cognitive deficits      Swallow Study   General Date of Onset: 06/23/18 HPI: Kelly [name] is a 78 y.o. male with history of dementia admitted with acute respiratory failure with hypoxia likely from acute bronchitis (suspected viral source). Febrile; CT chest without acute findings. Seen by SLP for bedside swallow 03/05/2016; mechanical soft/regular and thin liquids was recommended. Type of Study: Bedside Swallow Evaluation Previous Swallow Assessment: see HPI Diet Prior to this Study: Regular;Thin liquids Temperature Spikes Noted: Yes(101.3 3/13) Respiratory Status: Nasal cannula(pt had removed [place] when SLP arrived; replaced) History of Recent Intubation: No Behavior/Cognition: Lethargic/Drowsy;Cooperative(rouses with tactile cues) Oral Cavity Assessment: Within Functional Limits Oral Care Completed by SLP: No Oral Cavity - Dentition: Adequate natural dentition Vision: Functional for self-feeding Self-Feeding Abilities: Able to feed self;Other (Comment)(requests, "will you feed it to me?" after 2 bites self-feedi) Patient Positioning: Upright in bed Baseline Vocal Quality: Normal;Other (comment)(strained vocal quality) Volitional Cough: Strong Volitional Swallow: Able to elicit    Oral/Motor/Sensory Function Overall Oral Motor/Sensory Function: Within functional limits   Ice Chips Ice chips: Within functional limits  Thin Liquid Thin Liquid: Impaired Oral Phase Impairments: Poor  awareness of bolus;Reduced labial seal Oral Phase Functional Implications: Other (comment)(bilateral anterior loss with cup sip) Pharyngeal  Phase Impairments: Cough - Immediate(x1 with cup sip of thin, suspect premature spillage)    Nectar Thick Nectar Thick Liquid: Not tested   Honey Thick Honey Thick Liquid: Not tested   Puree Puree: Within functional limits Presentation: Self Fed;Spoon(clinician fed after 2 bites d/t pt request)   Solid     Solid: Impaired Presentation: Self Fed Oral Phase Functional Implications: Oral residue     Rondel Baton, MS, CCC-SLP Speech-Language Pathologist Acute Rehabilitation Services Pager: [phone] Office: [phone]  Arlana Lindau 06/24/2018,2:47 PM

## 2018-06-24 NOTE — Progress Notes (Signed)
The patient was admitted early this morning after midnight and H&P has been reviewed and I am in current agreement with assessment and plan done by Dr. Midge Minium.  Additional changes to the plan of care including medication adjustments have been made.  Is an 78 year old obese Caucasian male with a past medical history significant for dementia (Late Alzheimer's Disease), hypertension, hypertriglyceridemia, Hx of CVA, CAD, dilatation of the aorta, as well as other comorbidities who presents to the emergency room with a chief complaint of fever, cough, shortness of breath associated with generalized weakness.  Denies any abdominal complaints or nausea or vomiting and in the ED is found to be febrile with a temperature of 102.  He was found to be tachycardic and had a mild leukocytosis.  CT of the chest was done and did not show anything acute.  Patient was hypoxic and wheezy in the ED and influenza panel was sent and was negative however respiratory virus panel tested positive for RSV.  Was admitted for a working diagnosis of acute respiratory failure with hypoxia from acute bronchitis in the setting of RSV and he was placed on breathing treatments as well as nebulized Pulmicort and IV steroids.  Patient was a little somnolent this morning I ordered a head CT scan as well as an ABG ABG was unremarkable.  He is were elevated on admission and they have now improved as well as T bili.  We will continue to monitor patient's clinical response to intervention and repeat blood work and chest x-ray in the a.m.  We will also obtain a physical therapy evaluation.

## 2018-06-24 NOTE — Progress Notes (Signed)
CRITICAL VALUE ALERT  Critical Value:  Microbiology, RSV positive  Date & Time Notied:  06/24/18 0555  Provider Notified: 06/24/18 0555  Orders Received/Actions taken: n/a

## 2018-06-25 ENCOUNTER — Observation Stay (HOSPITAL_COMMUNITY): Payer: Medicare Other

## 2018-06-25 DIAGNOSIS — Z8673 Personal history of transient ischemic attack (TIA), and cerebral infarction without residual deficits: Secondary | ICD-10-CM | POA: Diagnosis not present

## 2018-06-25 DIAGNOSIS — R7303 Prediabetes: Secondary | ICD-10-CM | POA: Diagnosis present

## 2018-06-25 DIAGNOSIS — E781 Pure hyperglyceridemia: Secondary | ICD-10-CM | POA: Diagnosis present

## 2018-06-25 DIAGNOSIS — J9601 Acute respiratory failure with hypoxia: Secondary | ICD-10-CM | POA: Diagnosis present

## 2018-06-25 DIAGNOSIS — E782 Mixed hyperlipidemia: Secondary | ICD-10-CM | POA: Diagnosis not present

## 2018-06-25 DIAGNOSIS — R17 Unspecified jaundice: Secondary | ICD-10-CM | POA: Diagnosis present

## 2018-06-25 DIAGNOSIS — R0902 Hypoxemia: Secondary | ICD-10-CM | POA: Diagnosis present

## 2018-06-25 DIAGNOSIS — E669 Obesity, unspecified: Secondary | ICD-10-CM | POA: Diagnosis present

## 2018-06-25 DIAGNOSIS — Z7982 Long term (current) use of aspirin: Secondary | ICD-10-CM | POA: Diagnosis not present

## 2018-06-25 DIAGNOSIS — E785 Hyperlipidemia, unspecified: Secondary | ICD-10-CM | POA: Diagnosis present

## 2018-06-25 DIAGNOSIS — F0281 Dementia in other diseases classified elsewhere with behavioral disturbance: Secondary | ICD-10-CM | POA: Diagnosis present

## 2018-06-25 DIAGNOSIS — G301 Alzheimer's disease with late onset: Secondary | ICD-10-CM | POA: Diagnosis present

## 2018-06-25 DIAGNOSIS — Z96642 Presence of left artificial hip joint: Secondary | ICD-10-CM | POA: Diagnosis present

## 2018-06-25 DIAGNOSIS — J205 Acute bronchitis due to respiratory syncytial virus: Principal | ICD-10-CM

## 2018-06-25 DIAGNOSIS — Z7902 Long term (current) use of antithrombotics/antiplatelets: Secondary | ICD-10-CM | POA: Diagnosis not present

## 2018-06-25 DIAGNOSIS — I10 Essential (primary) hypertension: Secondary | ICD-10-CM | POA: Diagnosis present

## 2018-06-25 DIAGNOSIS — Z6831 Body mass index (BMI) 31.0-31.9, adult: Secondary | ICD-10-CM | POA: Diagnosis not present

## 2018-06-25 DIAGNOSIS — Z8249 Family history of ischemic heart disease and other diseases of the circulatory system: Secondary | ICD-10-CM | POA: Diagnosis not present

## 2018-06-25 DIAGNOSIS — I2510 Atherosclerotic heart disease of native coronary artery without angina pectoris: Secondary | ICD-10-CM | POA: Diagnosis present

## 2018-06-25 LAB — CBC WITH DIFFERENTIAL/PLATELET
Abs Immature Granulocytes: 0.05 10*3/uL (ref 0.00–0.07)
Basophils Absolute: 0.1 10*3/uL (ref 0.0–0.1)
Basophils Relative: 1 %
Eosinophils Absolute: 0.1 10*3/uL (ref 0.0–0.5)
Eosinophils Relative: 0 %
HEMATOCRIT: 45.9 % (ref 39.0–52.0)
Hemoglobin: 15.9 g/dL (ref 13.0–17.0)
IMMATURE GRANULOCYTES: 0 %
Lymphocytes Relative: 23 %
Lymphs Abs: 2.8 10*3/uL (ref 0.7–4.0)
MCH: 31.9 pg (ref 26.0–34.0)
MCHC: 34.6 g/dL (ref 30.0–36.0)
MCV: 92 fL (ref 80.0–100.0)
Monocytes Absolute: 1.8 10*3/uL — ABNORMAL HIGH (ref 0.1–1.0)
Monocytes Relative: 15 %
Neutro Abs: 7.3 10*3/uL (ref 1.7–7.7)
Neutrophils Relative %: 61 %
Platelets: 170 10*3/uL (ref 150–400)
RBC: 4.99 MIL/uL (ref 4.22–5.81)
RDW: 14.1 % (ref 11.5–15.5)
WBC: 12.1 10*3/uL — ABNORMAL HIGH (ref 4.0–10.5)
nRBC: 0 % (ref 0.0–0.2)

## 2018-06-25 LAB — COMPREHENSIVE METABOLIC PANEL
ALT: 36 U/L (ref 0–44)
AST: 27 U/L (ref 15–41)
Albumin: 3.2 g/dL — ABNORMAL LOW (ref 3.5–5.0)
Alkaline Phosphatase: 82 U/L (ref 38–126)
Anion gap: 7 (ref 5–15)
BUN: 15 mg/dL (ref 8–23)
CO2: 23 mmol/L (ref 22–32)
Calcium: 8.6 mg/dL — ABNORMAL LOW (ref 8.9–10.3)
Chloride: 107 mmol/L (ref 98–111)
Creatinine, Ser: 0.89 mg/dL (ref 0.61–1.24)
GFR calc Af Amer: >60 mL/min (ref >60)
GFR calc non Af Amer: >60 mL/min (ref >60)
Glucose, Bld: 134 mg/dL — ABNORMAL HIGH (ref 70–99)
Potassium: 4 mmol/L (ref 3.5–5.1)
Sodium: 137 mmol/L (ref 135–145)
Total Bilirubin: 0.7 mg/dL (ref 0.3–1.2)
Total Protein: 6.8 g/dL (ref 6.5–8.1)

## 2018-06-25 LAB — URINALYSIS, ROUTINE W REFLEX MICROSCOPIC
Bilirubin Urine: NEGATIVE
Glucose, UA: NEGATIVE mg/dL
Hgb urine dipstick: NEGATIVE
Ketones, ur: NEGATIVE mg/dL
Leukocytes,Ua: NEGATIVE
Nitrite: NEGATIVE
PROTEIN: NEGATIVE mg/dL
Specific Gravity, Urine: 1.029 (ref 1.005–1.030)
pH: 5 (ref 5.0–8.0)

## 2018-06-25 LAB — HEMOGLOBIN A1C
Hgb A1c MFr Bld: 6 % — ABNORMAL HIGH (ref 4.8–5.6)
Mean Plasma Glucose: 125.5 mg/dL

## 2018-06-25 LAB — PHOSPHORUS: Phosphorus: 2.9 mg/dL (ref 2.5–4.6)

## 2018-06-25 LAB — MAGNESIUM: Magnesium: 2 mg/dL (ref 1.7–2.4)

## 2018-06-25 LAB — STREP PNEUMONIAE URINARY ANTIGEN: Strep Pneumo Urinary Antigen: NEGATIVE

## 2018-06-25 MED ORDER — ALBUTEROL SULFATE (2.5 MG/3ML) 0.083% IN NEBU
2.5000 mg | INHALATION_SOLUTION | Freq: Two times a day (BID) | RESPIRATORY_TRACT | Status: DC
  Administered 2018-06-25: 2.5 mg via RESPIRATORY_TRACT
  Filled 2018-06-25: qty 3

## 2018-06-25 MED ORDER — DIVALPROEX SODIUM 500 MG PO DR TAB
500.0000 mg | DELAYED_RELEASE_TABLET | Freq: Every day | ORAL | Status: DC
  Administered 2018-06-25: 500 mg via ORAL
  Filled 2018-06-25: qty 1

## 2018-06-25 MED ORDER — LOSARTAN POTASSIUM 50 MG PO TABS
50.0000 mg | ORAL_TABLET | Freq: Every day | ORAL | Status: DC
  Administered 2018-06-25 – 2018-06-26 (×2): 50 mg via ORAL
  Filled 2018-06-25 (×2): qty 1

## 2018-06-25 NOTE — Care Management Obs Status (Signed)
MEDICARE OBSERVATION STATUS NOTIFICATION   Patient Details  Name: Kelly [name] MRN: [id number] Date of Birth: 03/12/1941   Medicare Observation Status Notification Given:  Yes    Asencion Guisinger B Cason Luffman, LCSWA 06/25/2018, 10:59 AM

## 2018-06-25 NOTE — Evaluation (Signed)
Physical Therapy Evaluation Patient Details Name: Kelly [name] MRN: [id number] DOB: 1940-10-12 Today's Date: 06/25/2018   History of Present Illness  Patient is a 78 y/o male who presents with SOB, cough and fever. Admited with acute respiratory failure with hypoxia, + for RSV. PMH includes dementia, HTN, CVA.  Clinical Impression  Patient presents with generalized weakness, deconditioning, impaired balance and decreased safety awareness s/p above. Pt not a great historian secondary to hx of dementia. Called [facility] and they report pt was Mod I with RW and doing own ADls PTA. Today, pt required Min A for standing transfer esp from low surfaces. Close Min guard needed for ambulation with RW. No DOE noted and no overt LOB. Not able to get a good Sp02 reading. Pt will likely need a little more help once d/ced to ALF and per staff at facility, they will be able to assist as needed. Will follow acutely to maximize independence and mobility prior to return home.     Follow Up Recommendations Home health PT;Supervision for mobility/OOB    Equipment Recommendations  None recommended by PT    Recommendations for Other Services       Precautions / Restrictions Precautions Precautions: Fall Restrictions Weight Bearing Restrictions: No      Mobility  Bed Mobility               General bed mobility comments: Up in chair upon PT arrival.   Transfers Overall transfer level: Needs assistance Equipment used: Rolling walker (2 wheeled) Transfers: Sit to/from Stand Sit to Stand: Min assist         General transfer comment: Assist to power to standing with cues for hand placement/technique. Stood from Orthoptist. from toilet x1.   Ambulation/Gait Ambulation/Gait assistance: Min guard Gait Distance (Feet): 20 Feet(x2 bouts) Assistive device: Rolling walker (2 wheeled) Gait Pattern/deviations: Step-through pattern;Decreased stride length;Trunk flexed;Wide base of support Gait  velocity: decreased   General Gait Details: Slow, mildly unsteady gait with forward flexed posture, cues for RW proximity. No SOB noted. Not able to get Sp02 reading. HR stable.  Stairs            Wheelchair Mobility    Modified Rankin (Stroke Patients Only)       Balance Overall balance assessment: Needs assistance Sitting-balance support: Feet unsupported;No upper extremity supported Sitting balance-Leahy Scale: Good     Standing balance support: During functional activity Standing balance-Leahy Scale: Poor Standing balance comment: Requires UE support in standing.                              Pertinent Vitals/Pain Pain Assessment: Faces Faces Pain Scale: No hurt    Home Living Family/patient expects to be discharged to:: Assisted living([facility])               Home Equipment: Dan Humphreys - 2 wheels      Prior Function Level of Independence: Independent with assistive device(s)   Gait / Transfers Assistance Needed: Spoke with [employer]- stated pt is Mod I with RW and does his own ADLs.      Comments: Pt with hx of dementia so not a great historian. Reports he lives in his own house in [place] and he does not have any doctors.      Hand Dominance   Dominant Hand: Right    Extremity/Trunk Assessment   Upper Extremity Assessment Upper Extremity Assessment: Defer to OT evaluation  Lower Extremity Assessment Lower Extremity Assessment: Generalized weakness    Cervical / Trunk Assessment Cervical / Trunk Assessment: Kyphotic  Communication   Communication: No difficulties  Cognition Arousal/Alertness: Awake/alert Behavior During Therapy: Impulsive Overall Cognitive Status: Impaired/Different from baseline Area of Impairment: Orientation;Memory;Safety/judgement;Following commands                 Orientation Level: Disoriented to;Situation   Memory: Decreased short-term memory Following Commands: Follows multi-step  commands inconsistently;Follows one step commands with increased time(requires repetition ) Safety/Judgement: Decreased awareness of safety;Decreased awareness of deficits     General Comments: COnfused reporting he has no doctors and he came here to get some rest which has not happened.       General Comments      Exercises     Assessment/Plan    PT Assessment Patient needs continued PT services  PT Problem List Decreased strength;Decreased balance;Decreased cognition;Decreased mobility;Decreased activity tolerance;Decreased safety awareness       PT Treatment Interventions DME instruction;Balance training;Patient/family education;Functional mobility training;Therapeutic activities;Gait training;Therapeutic exercise    PT Goals (Current goals can be found in the Care Plan section)  Acute Rehab PT Goals Patient Stated Goal: to get some rest PT Goal Formulation: With patient Time For Goal Achievement: 07/09/18 Potential to Achieve Goals: Good    Frequency Min 3X/week   Barriers to discharge Decreased caregiver support      Co-evaluation               AM-PAC PT "6 Clicks" Mobility  Outcome Measure Help needed turning from your back to your side while in a flat bed without using bedrails?: A Little Help needed moving from lying on your back to sitting on the side of a flat bed without using bedrails?: A Lot Help needed moving to and from a bed to a chair (including a wheelchair)?: A Little Help needed standing up from a chair using your arms (e.g., wheelchair or bedside chair)?: A Little Help needed to walk in hospital room?: A Little Help needed climbing 3-5 steps with a railing? : A Lot 6 Click Score: 16    End of Session Equipment Utilized During Treatment: Gait belt Activity Tolerance: Patient tolerated treatment well Patient left: in chair;with call bell/phone within reach;with chair alarm set Nurse Communication: Mobility status PT Visit Diagnosis:  Unsteadiness on feet (R26.81);Muscle weakness (generalized) (M62.81);Difficulty in walking, not elsewhere classified (R26.2)    Time: 3762-8315 PT Time Calculation (min) (ACUTE ONLY): 21 min   Charges:   PT Evaluation $PT Eval Moderate Complexity: 1 Mod          Mylo Red, PT, DPT Acute Rehabilitation Services Pager [phone] Office [phone]      Blake Divine A Lanier Ensign 06/25/2018, 3:20 PM

## 2018-06-25 NOTE — Progress Notes (Signed)
PROGRESS NOTE    Kelly [name]  ZOX:[id number] DOB: 1940/05/03 DOA: 06/23/2018 PCP: Lauro Regulus, MD   Brief Narrative:  The patient is an 78 year old obese Caucasian male with a past medical history significant for dementia (Late Alzheimer's Disease), hypertension, hypertriglyceridemia, Hx of CVA, CAD, dilatation of the aorta, as well as other comorbidities who presents to the emergency room with a chief complaint of fever, cough, shortness of breath associated with generalized weakness.  Denies any abdominal complaints or nausea or vomiting and in the ED is found to be febrile with a temperature of 102.  He was found to be tachycardic and had a mild leukocytosis.  CT of the chest was done and did not show anything acute.  Patient was hypoxic and wheezy in the ED and influenza panel was sent and was negative however respiratory virus panel tested positive for RSV.    He was admitted for a working diagnosis of acute respiratory failure with hypoxia from acute bronchitis in the setting of RSV and he was placed on breathing treatments as well as nebulized Pulmicort and IV steroids.  Patient was a little somnolent  Yesterday morning I ordered a head CT scan as well as an ABG, both of which were unremarkable. His LFTs on admission were elevated and they have now improved as well as T bili.  Patient has improved to intervention and awaiting PT/OT to evaluate. Hospitalization has been complicated by confusion and mild delirium. Will await PT recommendations to evaluate if he is stable to go back to ALF or if he needs SNF.  Assessment & Plan:   Principal Problem:   Acute respiratory failure with hypoxia (HCC) Active Problems:   CAD (coronary artery disease)   Hyperlipidemia   Essential hypertension  Acute Respiratory Failure with Hypoxia likely from Acute Bronchitis in the Setting of RSV Infection  -Since patient was hypoxic patient was been admitted for observation.   -ABG done on 3 Liters  showed 7.431/35.2/79.9/23.0/96.3% -Blood cultures never sent amd sputum culture never done -Respiratory viral panel was positive for RSV -Urine for Legionella and Strep antigen has been ordered. Strep Pneumo Urinary Ag Negative and Urine Legionella is pending -U/A Unremarkable and Urine Cx pending -CT Chest 06/23/2018 showed No evidence of active pulmonary disease. -Repeat CXR this AM showed no consolidative pulmonary opacities. No pleural effusion or pneumothorax. Overall impression was No acute cardiopulmonary process -Currently on Albuterol 2.5 mg Neb BID along with q2hprn wheezing; -C/w Budesonide 0.25 mg Neb BID -C/w IV Solumedrol 40 mg Daily and place on Sterapred Taper at D/C -Continue Supplemental O2 via [place] and wean O2 as tolerated; No Longer requiring O2 via [place] -Continuous Pulse Oximetry and Maintain O2 Saturations >90% -Will obtain PT/OT evaluation prior to D/C   CAD  -CT Scan of the chest shows Normal heart size. No pericardial effusions. Coronary artery calcifications. Normal caliber thoracic aorta with scattered calcification. -Denies any chest pain.   -C/w ASA 81 mg po Daily, Clopidogrel 75 mg po Daily, Atorvastatin 40 mg po Daily, and Losartan 50 mg po qDaily -Not on BB  -Continue to Monitor   Abnormal LFTs/Elevated LFTs -Was mildly Elevated on Admission with AST at 48 and ALT at 51 -Trended down now and ALT is 27 and ALT is 36 -C/w Statin -Continue to Monitor and Trend Liver Function and repeat CMP in AM  Hyperbilirubinemia -Was Mild on admission and trended down from 1.5 to 0.7 -Likely Reactive -Continue to Monitor and repeat CMP in AM  Hx of Alzheimer's Dementia with superimposed Delirium -Appeared Somnolent yesterday so Head CT and ABG done -Head CT showed No evidence of acute intracranial abnormality. Mild small vessel ischemic changes -ABG showed no hypercarbia and is as above -C/w Donepezil 10 mg po qHS -Only alert to Self and thinks the year is 62 and  that he is 78 years old -C/w Divalproex 500 mg opo qHS -Placed on Delirium Precautions  Hx of CVA -C/w ASA 81 mg po Daily, Clopidogrel 75 mg po Daily, and Atorvastatin 40 mg po Daily   Leukocytosis -In the setting of RSV infection and Steroid Demargination -WBC went from 11.1 -> 12.1 (Likely from steroids and is on IV Solumedrol 40 mg) -Continue to Monitor and currently holding Abx -Repeat CBC in AM   Pre-Diabetes -Patient's HbA1c was 6.0 -Blood Sugars ranging from 134-150 on Daily BMP/CMP and likely worsened in the setting of IV Steroid Demargination -If consistently elevated will place on Sensitive Novolog SSI AC  Obesity -Estimated body mass index is 31.56 kg/m as calculated from the following:   Height as of this encounter: 6' 2.5" (1.892 m).   Weight as of this encounter: 113 kg. -Weight Loss and Dietary Counseling given   DVT prophylaxis: Enoxaparin 40 mg sq q24h (refused this AM) Code Status: FULL CODE Family Communication: No family present at bedside  Disposition Plan: Obtain PT/OT Evaluation and likely SNF vs back to his ALF   Consultants:   None   Procedures:  None   Antimicrobials:  Anti-infectives (From admission, onward)   None     Subjective: Seen and examined at bedside and was a little confused this morning but was alert to self.  Thought the year is 56 and that he is 78 years old.  No nausea or vomiting.  States his breathing is improved.  No other concerns or complaints at this time except he was hungry.  Still awaiting physical therapy to evaluate.  Objective: Vitals:   06/24/18 2043 06/24/18 2324 06/25/18 0737 06/25/18 0756  BP:  118/80 132/83   Pulse:  60 65 62  Resp:  16 16 16   Temp:  97.9 F (36.6 C) (!) 97.5 F (36.4 C)   TempSrc:  Oral Oral   SpO2: 97% 100% 97% 98%  Weight:      Height:        Intake/Output Summary (Last 24 hours) at 06/25/2018 1340 Last data filed at 06/25/2018 0800 Gross per 24 hour  Intake 170 ml  Output -   Net 170 ml   Filed Weights   06/23/18 1741 06/24/18 0000  Weight: 113.4 kg 113 kg   Examination: Physical Exam:  Constitutional: WN/WD obese Caucasian male in NAD and appears calm and comfortable Eyes:  Lids and conjunctivae normal, sclerae anicteric  ENMT: External Ears, Nose appear normal. Grossly normal hearing. Mucous membranes are moist. Posterior pharynx clear of any exudate or lesions. Normal dentition.  Neck: Appears normal, supple, no cervical masses, normal ROM, no appreciable thyromegaly; no JVD Respiratory: Diminished to auscultation bilaterally, no wheezing, rales, rhonchi or crackles. Normal respiratory effort and patient is not tachypenic. No accessory muscle use. Unlabored breathing   Cardiovascular: RRR, no murmurs / rubs / gallops. S1 and S2 auscultated. No extremity edema. Abdomen: Soft, non-tender, Distended due to body habitus. No masses palpated. No appreciable hepatosplenomegaly. Bowel sounds positive x4.  GU: Deferred. Musculoskeletal: No clubbing / cyanosis of digits/nails. No joint deformity upper and lower extremities.  Skin: No rashes, lesions, ulcers on a limited  skin evaluation. No induration; Warm and dry.  Neurologic: CN 2-12 grossly intact with no focal deficits. Has some Residual deficits from prior CVA Psychiatric: Impaired judgment and insight. Alert and oriented x 1. Slightly agitated mood and appropriate affect.   Data Reviewed: I have personally reviewed following labs and imaging studies  CBC: Recent Labs  Lab 06/23/18 1751 06/24/18 0129 06/25/18 0307  WBC 12.3* 11.1* 12.1*  NEUTROABS 7.5  --  7.3  HGB 15.9 15.7 15.9  HCT 48.8 45.8 45.9  MCV 92.6 91.6 92.0  PLT 210 166 170   Basic Metabolic Panel: Recent Labs  Lab 06/23/18 1751 06/24/18 0129 06/25/18 0307  NA 136 136 137  K 4.3 3.8 4.0  CL 105 106 107  CO2 22 21* 23  GLUCOSE 115* 150* 134*  BUN 13 13 15   CREATININE 0.96 0.95 0.89  CALCIUM 8.7* 8.6* 8.6*  MG  --   --  2.0   PHOS  --   --  2.9   GFR: Estimated Creatinine Clearance: 89.1 mL/min (by C-G formula based on SCr of 0.89 mg/dL). Liver Function Tests: Recent Labs  Lab 06/23/18 1751 06/24/18 0703 06/25/18 0307  AST 48* 31 27  ALT 51* 39 36  ALKPHOS 80 75 82  BILITOT 1.5* 1.0 0.7  PROT 7.2 6.6 6.8  ALBUMIN 3.5 3.2* 3.2*   No results for input(s): LIPASE, AMYLASE in the last 168 hours. No results for input(s): AMMONIA in the last 168 hours. Coagulation Profile: No results for input(s): INR, PROTIME in the last 168 hours. Cardiac Enzymes: Recent Labs  Lab 06/24/18 0129 06/24/18 0703 06/24/18 1325  TROPONINI 0.03* <0.03 <0.03   BNP (last 3 results) No results for input(s): PROBNP in the last 8760 hours. HbA1C: Recent Labs    06/25/18 0307  HGBA1C 6.0*   CBG: No results for input(s): GLUCAP in the last 168 hours. Lipid Profile: No results for input(s): CHOL, HDL, LDLCALC, TRIG, CHOLHDL, LDLDIRECT in the last 72 hours. Thyroid Function Tests: No results for input(s): TSH, T4TOTAL, FREET4, T3FREE, THYROIDAB in the last 72 hours. Anemia Panel: No results for input(s): VITAMINB12, FOLATE, FERRITIN, TIBC, IRON, RETICCTPCT in the last 72 hours. Sepsis Labs: No results for input(s): PROCALCITON, LATICACIDVEN in the last 168 hours.  Recent Results (from the past 240 hour(s))  Respiratory Panel by PCR     Status: Abnormal   Collection Time: 06/23/18  6:40 PM  Result Value Ref Range Status   Adenovirus NOT DETECTED NOT DETECTED Final   Coronavirus 229E NOT DETECTED NOT DETECTED Final    Comment: (NOTE) The Coronavirus on the Respiratory Panel, DOES NOT test for the novel  Coronavirus (2019 nCoV)    Coronavirus HKU1 NOT DETECTED NOT DETECTED Final   Coronavirus NL63 NOT DETECTED NOT DETECTED Final   Coronavirus OC43 NOT DETECTED NOT DETECTED Final   Metapneumovirus NOT DETECTED NOT DETECTED Final   Rhinovirus / Enterovirus NOT DETECTED NOT DETECTED Final   Influenza A NOT DETECTED  NOT DETECTED Final   Influenza B NOT DETECTED NOT DETECTED Final   Parainfluenza Virus 1 NOT DETECTED NOT DETECTED Final   Parainfluenza Virus 2 NOT DETECTED NOT DETECTED Final   Parainfluenza Virus 3 NOT DETECTED NOT DETECTED Final   Parainfluenza Virus 4 NOT DETECTED NOT DETECTED Final   Respiratory Syncytial Virus DETECTED (A) NOT DETECTED Final    Comment: CRITICAL RESULT CALLED TO, READ BACK BY AND VERIFIED WITH: Mort Sawyers [id number] 06/24/2018 T. TYSOR    Bordetella pertussis NOT  DETECTED NOT DETECTED Final   Chlamydophila pneumoniae NOT DETECTED NOT DETECTED Final   Mycoplasma pneumoniae NOT DETECTED NOT DETECTED Final    Comment: Performed at [facility], [address], [place], [place] [address]  MRSA PCR Screening     Status: None   Collection Time: 06/24/18 12:22 AM  Result Value Ref Range Status   MRSA by PCR NEGATIVE NEGATIVE Final    Comment:        The GeneXpert MRSA Assay (FDA approved for NASAL specimens only), is one component of a comprehensive MRSA colonization surveillance program. It is not intended to diagnose MRSA infection nor to guide or monitor treatment for MRSA infections. Performed at [facility], [address], [place], [place] [address]      Radiology Studies: Ct Head Wo Contrast  Result Date: 06/24/2018 CLINICAL DATA:  Acute confusion EXAM: CT HEAD WITHOUT CONTRAST TECHNIQUE: Contiguous axial images were obtained from the base of the skull through the vertex without intravenous contrast. COMPARISON:  CT head dated 03/03/2016 FINDINGS: Brain: No evidence of acute infarction, hemorrhage, hydrocephalus, extra-axial collection or mass lesion/mass effect. Subcortical white matter and periventricular small vessel ischemic changes. Vascular: Mild intracranial atherosclerosis. Skull: Normal. Negative for fracture or focal lesion. Sinuses/Orbits: Visualized paranasal sinuses are essentially clear. Partial opacification of the left mastoid air  cells. Other: None. IMPRESSION: No evidence of acute intracranial abnormality. Mild small vessel ischemic changes. Electronically Signed   By: Charline Bills M.D.   On: 06/24/2018 11:18   Ct Chest Wo Contrast  Result Date: 06/23/2018 CLINICAL DATA:  Nonproductive cough for 2 days. Low grade fever. EXAM: CT CHEST WITHOUT CONTRAST TECHNIQUE: Multidetector CT imaging of the chest was performed following the standard protocol without IV contrast. COMPARISON:  08/26/2009 FINDINGS: Cardiovascular: Normal heart size. No pericardial effusions. Coronary artery calcifications. Normal caliber thoracic aorta with scattered calcification. Mediastinum/Nodes: Scattered mediastinal lymph nodes are not pathologically enlarged and are unchanged since prior study, likely reactive. Esophagus is decompressed. Lungs/Pleura: Mild dependent changes in the lung bases. No airspace disease or consolidation. No pleural effusions. No pneumothorax. Airways are patent. Upper Abdomen: No acute abnormality demonstrated. Musculoskeletal: Degenerative changes in the spine. No destructive bone lesions. IMPRESSION: No evidence of active pulmonary disease. Electronically Signed   By: Burman Nieves M.D.   On: 06/23/2018 22:21   Dg Chest Port 1 View  Result Date: 06/25/2018 CLINICAL DATA:  Shortness of breath. EXAM: PORTABLE CHEST 1 VIEW COMPARISON:  Chest radiograph 06/23/2018 FINDINGS: Monitoring leads overlie the patient. Stable cardiomegaly. No consolidative pulmonary opacities. No pleural effusion or pneumothorax. IMPRESSION: No acute cardiopulmonary process. Electronically Signed   By: Annia Belt M.D.   On: 06/25/2018 12:07   Dg Chest Port 1 View  Result Date: 06/23/2018 CLINICAL DATA:  Cough EXAM: PORTABLE CHEST 1 VIEW COMPARISON:  03/03/2016 FINDINGS: Mild cardiomegaly with pulmonary vascular congestion. No focal airspace consolidation or overt pulmonary edema. No pleural effusion or pneumothorax. IMPRESSION: Cardiomegaly and  pulmonary vascular congestion without overt pulmonary edema. Electronically Signed   By: Deatra Robinson M.D.   On: 06/23/2018 18:09   Scheduled Meds: . albuterol  2.5 mg Nebulization BID  . aspirin  81 mg Oral Daily  . atorvastatin  40 mg Oral Daily  . budesonide (PULMICORT) nebulizer solution  0.25 mg Nebulization BID  . clopidogrel  75 mg Oral Daily  . divalproex  500 mg Oral QHS  . donepezil  10 mg Oral QHS  . enoxaparin (LOVENOX) injection  40 mg Subcutaneous Q24H  . methylPREDNISolone (SOLU-MEDROL) injection  40 mg Intravenous Daily  . pantoprazole  40 mg Oral Q1200   Continuous Infusions:   LOS: 0 days   Merlene Laughter, DO [facility] PAGER is on AMION  If 7PM-7AM, please contact night-coverage [email or web address] Password [email or web address] 06/25/2018, 1:40 PM

## 2018-06-25 NOTE — Progress Notes (Signed)
Daily Nursing Note  Received sign out from evening nurse, Harrold Donath. Introduced self to patient who endorsed his name and that he was in the hospital. Though upon further orientation assessment it was identified that the patient felt he was in the hospital to rest. Patient reoriented and emphasized that he was here for an RSV infection which we were symptomatically managing. Due to the patients underlying h/o alzheimer's dementia he is high risk for delirium. Delirium precaution initiated. PT/OT c/s ordered in afternoon for discharge planning. Patients daughter, Camelia Eng updated via telephone.   Disposition Plan: Lives at [facility] in [place]

## 2018-06-26 LAB — LEGIONELLA PNEUMOPHILA SEROGP 1 UR AG: L. pneumophila Serogp 1 Ur Ag: NEGATIVE

## 2018-06-26 MED ORDER — ONDANSETRON HCL 4 MG PO TABS: 4.0000 mg | ORAL_TABLET | Freq: Four times a day (QID) | ORAL | 0 refills | Status: AC | PRN

## 2018-06-26 MED ORDER — PREDNISONE 20 MG PO TABS
60.0000 mg | ORAL_TABLET | Freq: Every day | ORAL | Status: DC
  Administered 2018-06-26: 60 mg via ORAL
  Filled 2018-06-26: qty 3

## 2018-06-26 MED ORDER — PREDNISONE 10 MG (21) PO TBPK: ORAL_TABLET | ORAL | 0 refills | Status: DC

## 2018-06-26 MED ORDER — ALBUTEROL SULFATE (2.5 MG/3ML) 0.083% IN NEBU: 2.5000 mg | INHALATION_SOLUTION | RESPIRATORY_TRACT | 12 refills | Status: AC | PRN

## 2018-06-26 MED ORDER — PANTOPRAZOLE SODIUM 40 MG PO TBEC: 40.0000 mg | DELAYED_RELEASE_TABLET | Freq: Every day | ORAL | 0 refills | Status: DC

## 2018-06-26 NOTE — Progress Notes (Signed)
Paged [facility] re pt pulling out INT and refusing to have another INT placed.

## 2018-06-26 NOTE — Evaluation (Signed)
Occupational Therapy Evaluation Patient Details Name: Kelly [name] MRN: [id number] DOB: 06-25-1940 Today's Date: 06/26/2018    History of Present Illness Patient is a 78 y/o male who presents with SOB, cough and fever. Admited with acute respiratory failure with hypoxia, + for RSV. PMH includes dementia, HTN, CVA.   Clinical Impression   PTA, pt was at [facility] and received assistance with ADLs and functional mobility with RW. Pt currently requiring Min A for UB ADLs, Max A for LB ADLs, and Min A for functional mobility with RW. Pt presenting near baseline function. Pt presenting with decreased cognition, strength, and balance. Pt with bowel incontinence while at sink brushing teeth and required Max A for toilet hygiene. Pt planning for dc today and all further OT needs may be met with HHOT. Recommend dc to ALF with HHOT for further OT to optimize safety, independence with ADLs, and return to PLOF. All acute OT need met and will sign off.      Follow Up Recommendations  Home health OT;Supervision/Assistance - 24 hour(ALF)    Equipment Recommendations  None recommended by OT    Recommendations for Other Services PT consult     Precautions / Restrictions Precautions Precautions: Fall Restrictions Weight Bearing Restrictions: No      Mobility Bed Mobility Overal bed mobility: Needs Assistance Bed Mobility: Supine to Sit     Supine to sit: Mod assist     General bed mobility comments: Mod A to elevate trunk  Transfers Overall transfer level: Needs assistance Equipment used: Rolling walker (2 wheeled) Transfers: Sit to/from Stand Sit to Stand: Min assist         General transfer comment: Assist to power to standing with cues for hand placement/technique.    Balance Overall balance assessment: Needs assistance Sitting-balance support: Feet unsupported;No upper extremity supported Sitting balance-Leahy Scale: Good     Standing balance support: During  functional activity Standing balance-Leahy Scale: Poor Standing balance comment: Requires UE support in standing.                            ADL either performed or assessed with clinical judgement   ADL Overall ADL's : Needs assistance/impaired Eating/Feeding: Independent;Sitting   Grooming: Minimal assistance;Oral care;Standing Grooming Details (indicate cue type and reason): Min A for standing balance and posterior lean Upper Body Bathing: Minimal assistance;Sitting   Lower Body Bathing: Maximal assistance;Sit to/from stand   Upper Body Dressing : Minimal assistance;Sitting   Lower Body Dressing: Maximal assistance;Sit to/from stand   Toilet Transfer: Minimal assistance;BSC;RW   Toileting- Clothing Manipulation and Hygiene: Maximal assistance;Sit to/from stand       Functional mobility during ADLs: Minimal assistance;Rolling walker General ADL Comments: Pt near baseline function. Requiring increased assistance for ADls due to congition. Pt with bowel incontience      Vision         Perception     Praxis      Pertinent Vitals/Pain Pain Assessment: Faces Faces Pain Scale: No hurt Pain Intervention(s): Monitored during session;Limited activity within patient's tolerance;Repositioned     Hand Dominance Right   Extremity/Trunk Assessment Upper Extremity Assessment Upper Extremity Assessment: Generalized weakness   Lower Extremity Assessment Lower Extremity Assessment: Defer to PT evaluation   Cervical / Trunk Assessment Cervical / Trunk Assessment: Kyphotic   Communication Communication Communication: No difficulties   Cognition Arousal/Alertness: Awake/alert Behavior During Therapy: Impulsive Overall Cognitive Status: Impaired/Different from baseline Area of Impairment: Orientation;Memory;Safety/judgement;Following  commands                 Orientation Level: Disoriented to;Situation   Memory: Decreased short-term memory Following  Commands: Follows multi-step commands inconsistently;Follows one step commands with increased time(requires repetition ) Safety/Judgement: Decreased awareness of safety;Decreased awareness of deficits     General Comments: Baseline dementia. Decreased awareness. pleasant and agreeable to therapy. Bowel incontience while at the sink   General Comments  SpO2 90s on RA    Exercises     Shoulder Instructions      Home Living Family/patient expects to be discharged to:: Assisted living([facility])                             Home Equipment: Gilford Rile - 2 wheels          Prior Functioning/Environment Level of Independence: Independent with assistive device(s)  Gait / Transfers Assistance Needed: Per chart review - stated pt is Mod I with RW and does his own ADLs.      Comments: Pt with hx of dementia so not a great historian. Reports he lives in his own house in [place] and he does not have any doctors.         OT Problem List: Decreased strength;Decreased range of motion;Decreased activity tolerance;Impaired balance (sitting and/or standing);Decreased knowledge of use of DME or AE;Decreased knowledge of precautions      OT Treatment/Interventions:      OT Goals(Current goals can be found in the care plan section) Acute Rehab OT Goals Patient Stated Goal: to get some rest OT Goal Formulation: All assessment and education complete, DC therapy  OT Frequency:     Barriers to D/C:            Co-evaluation              AM-PAC OT "6 Clicks" Daily Activity     Outcome Measure Help from another person eating meals?: None Help from another person taking care of personal grooming?: A Little Help from another person toileting, which includes using toliet, bedpan, or urinal?: A Lot Help from another person bathing (including washing, rinsing, drying)?: A Lot Help from another person to put on and taking off regular upper body clothing?: A Little Help from another  person to put on and taking off regular lower body clothing?: A Lot 6 Click Score: 16   End of Session Equipment Utilized During Treatment: Gait belt;Rolling walker Nurse Communication: Mobility status  Activity Tolerance: Patient tolerated treatment well Patient left: with call bell/phone within reach(BSC with NT and RN)  OT Visit Diagnosis: Unsteadiness on feet (R26.81);Other abnormalities of gait and mobility (R26.89);Muscle weakness (generalized) (M62.81);Other symptoms and signs involving cognitive function                Time: 9292-4462 OT Time Calculation (min): 22 min Charges:  OT General Charges $OT Visit: 1 Visit OT Evaluation $OT Eval Moderate Complexity: [place], OTR/L Acute Rehab Pager: [phone] Office: [place] 06/26/2018, 2:12 PM

## 2018-06-26 NOTE — Discharge Summary (Signed)
Physician Discharge Summary  Kelly [name] KGU:[id number] DOB: [birth year] DOA: 06/23/2018  PCP: Lauro Regulus, MD  Admit date: 06/23/2018 Discharge date: 06/26/2018  Admitted From: Home (ALF) Disposition: Home with Home Health PT  Recommendations for Outpatient Follow-up:  1. Follow up with PCP in 1-2 weeks 2. Follow up with Podiatry in 1-2 weeks 3. Please obtain CMP/CBC, Mag, Phos in one week 4. Please follow up on the following pending results:  Home Health: Yes Equipment/Devices: None recommended by PT    Discharge Condition: Stable  CODE STATUS: FULL CODE Diet recommendation: Heart Healthy Diet  Brief/Interim Summary: The patient is an 78 year old obese Caucasian male with a past medical history significant for dementia (Late Alzheimer's Disease), hypertension, hypertriglyceridemia, Hx ofCVA,CAD, dilatation of the aorta, as well as other comorbidities who presents to the emergency room with a chief complaint of fever, cough, shortness of breath associated with generalized weakness.Denies any abdominal complaints or nausea or vomiting and in the ED is found to be febrile with a temperature of 102. He was found to be tachycardic and had a mild leukocytosis. CT of the chest was done and did not show anything acute. Patient was hypoxic and wheezy in the ED and influenza panel was sent and was negative however respiratory virus panel tested positive for RSV.  He was admitted for a working diagnosis of acute respiratory failure with hypoxia from acute bronchitis in the setting of RSV and he was placed on breathing treatments as well as nebulized Pulmicort and IV steroids. Patient was a little somnolent  Yesterday morning I ordered a head CT scan as well as an ABG, both of which were unremarkable. His LFTs on admission were elevated and they have now improved as well as T bili. Patient has improved to intervention and awaiting PT/OT to evaluate. Hospitalization has been  complicated by confusion and mild delirium but is stable. PT recommending Home Health. He was deemed medically stable to D/C back to ALF and will need to follow up with PCP within 1 week.   Discharge Diagnoses:  Principal Problem:   Acute respiratory failure with hypoxia (HCC) Active Problems:   CAD (coronary artery disease)   Hyperlipidemia   Essential hypertension  Acute Respiratory Failure with Hypoxia likely from Acute Bronchitis in the Setting of RSV Infection, improved -Since patient was hypoxic patient was been admitted for observation.  -ABG done on 3 Liters showed 7.431/35.2/79.9/23.0/96.3% -Blood cultures never sent amd sputum culture never done -Respiratory viral panel was positive for RSV -Urine for Legionella and Strep antigen has been ordered. Strep Pneumo Urinary Ag Negative and Urine Legionella is pending -U/A Unremarkable and Urine Cx pending -CT Chest 06/23/2018 showed No evidence of active pulmonary disease. -Repeat CXR this AM showed no consolidative pulmonary opacities. No pleural effusion or pneumothorax. Overall impression was No acute cardiopulmonary process -Currently on Albuterol 2.5 mg Neb BID along with q2hprn wheezing and will continue for D/C -C/w Budesonide 0.25 mg Neb BID while hospitalized  -C/w IV Solumedrol 40 mg Daily and place on Sterapred Taper at D/C -Continue Supplemental O2 via [place] and wean O2 as tolerated; No Longer requiring O2 via [place] -Continuous Pulse Oximetry and Maintain O2 Saturations >90% -Will obtain PT/OT evaluation prior to D/C and recommended Home Health  -Follow up PCP at D/C  CAD  -CT Scan of the chest shows Normal heart size. No pericardial effusions. Coronary artery calcifications. Normal caliber thoracic aorta with scattered calcification. -Denies any chest pain.  -C/w ASA 81 mg po  Daily, Clopidogrel 75 mg po Daily, Atorvastatin 40 mg po Daily, and Losartan 50 mg po qDaily -Not on BB  -Continue to Monitor as an outpatient    Abnormal LFTs/Elevated LFTs -Was mildly Elevated on Admission with AST at 48 and ALT at 51 -Trended down now and ALT is 27 and ALT is 36 -C/w Statin -Continue to Monitor and Trend Liver Function and repeat CMP as an outpatient   Hyperbilirubinemia -Was Mild on admission and trended down from 1.5 to 0.7 -Likely Reactive -Continue to Monitor and repeat CMP as an outpatient   Hx of Alzheimer's Dementia with superimposed Delirium -Appeared Somnolent yesterday so Head CT and ABG done -Head CT showed No evidence of acute intracranial abnormality. Mild small vessel ischemic changes -ABG showed no hypercarbia and is as above -C/w Donepezil 10 mg po qHS -Only alert to Self and thinks the year is 80 and that he is 78 years old -C/w Divalproex 500 mg opo qHS -Placed on Delirium Precautions -Follow up with PCP at D/C   Hx of CVA -C/w ASA 81 mg po Daily, Clopidogrel 75 mg po Daily, and Atorvastatin 40 mg po Daily   Leukocytosis -In the setting of RSV infection and Steroid Demargination -WBC went from 11.1 -> 12.1 (Likely from steroids and is on IV Solumedrol 40 mg) -Continue to Monitor and currently holding Abx -Follow up with PCP to repeat after Steroid Course  Pre-Diabetes -Patient's HbA1c was 6.0 -Blood Sugars ranging from 134-150 on Daily BMP/CMP and likely worsened in the setting of IV Steroid Demargination -If consistently elevated will place on Sensitive Novolog SSI AC -Follow up with PCP at D/C   Obesity -Estimated body mass index is 31.56 kg/m as calculated from the following:   Height as of this encounter: 6' 2.5" (1.892 m).   Weight as of this encounter: 113 kg. -Weight Loss and Dietary Counseling given   Discharge Instructions  Discharge Instructions    Call MD for:  difficulty breathing, headache or visual disturbances   Complete by:  As directed    Call MD for:  extreme fatigue   Complete by:  As directed    Call MD for:  hives   Complete by:  As  directed    Call MD for:  persistant dizziness or light-headedness   Complete by:  As directed    Call MD for:  persistant nausea and vomiting   Complete by:  As directed    Call MD for:  redness, tenderness, or signs of infection (pain, swelling, redness, odor or green/yellow discharge around incision site)   Complete by:  As directed    Call MD for:  severe uncontrolled pain   Complete by:  As directed    Call MD for:  temperature >100.4   Complete by:  As directed    Diet - low sodium heart healthy   Complete by:  As directed    Discharge instructions   Complete by:  As directed    You were cared for by a hospitalist during your hospital stay. If you have any questions about your discharge medications or the care you received while you were in the hospital after you are discharged, you can call the unit and ask to speak with the hospitalist on call if the hospitalist that took care of you is not available. Once you are discharged, your primary care physician will handle any further medical issues. Please note that NO REFILLS for any discharge medications will be authorized once you  are discharged, as it is imperative that you return to your primary care physician (or establish a relationship with a primary care physician if you do not have one) for your aftercare needs so that they can reassess your need for medications and monitor your lab values.  Follow up with PCP and Podiatry. Take all medications as prescribed. If symptoms change or worsen please return to the ED for evaluation   Increase activity slowly   Complete by:  As directed      Allergies as of 06/26/2018   No Known Allergies     Medication List    TAKE these medications   albuterol (2.5 MG/3ML) 0.083% nebulizer solution Commonly known as:  PROVENTIL Take 3 mLs (2.5 mg total) by nebulization every 2 (two) hours as needed for wheezing.   aspirin 81 MG chewable tablet Chew 1 tablet (81 mg total) by mouth daily.    atorvastatin 40 MG tablet Commonly known as:  LIPITOR Take 40 mg by mouth at bedtime.   clopidogrel 75 MG tablet Commonly known as:  PLAVIX Take 1 tablet (75 mg total) by mouth daily.   clotrimazole 1 % cream Commonly known as:  LOTRIMIN Apply 1 application topically 2 (two) times daily as needed (rash).   divalproex 250 MG DR tablet Commonly known as:  DEPAKOTE Take 500 mg by mouth at bedtime.   donepezil 10 MG tablet Commonly known as:  ARICEPT Take 10 mg by mouth at bedtime.   losartan 50 MG tablet Commonly known as:  COZAAR Take 50 mg by mouth daily.   nystatin powder Generic drug:  nystatin Apply topically 3 (three) times daily as needed (rash).   ondansetron 4 MG tablet Commonly known as:  ZOFRAN Take 1 tablet (4 mg total) by mouth every 6 (six) hours as needed for nausea.   pantoprazole 40 MG tablet Commonly known as:  PROTONIX Take 1 tablet (40 mg total) by mouth daily at 12 noon.   predniSONE 10 MG (21) Tbpk tablet Commonly known as:  STERAPRED UNI-PAK 21 TAB Take 6 Tablets on Day 1, 5 Tablets on Day 2, 4 Tablets on Day 3, 3 Tablets on Day 4, 2 Tablets on Day 5, 1 Tablet on Day 6, and then Stop Day 7   sennosides-docusate sodium 8.6-50 MG tablet Commonly known as:  SENOKOT-S Take 1 tablet by mouth at bedtime as needed for constipation.       No Known Allergies  Consultations:  None  Procedures/Studies: Ct Head Wo Contrast  Result Date: 06/24/2018 CLINICAL DATA:  Acute confusion EXAM: CT HEAD WITHOUT CONTRAST TECHNIQUE: Contiguous axial images were obtained from the base of the skull through the vertex without intravenous contrast. COMPARISON:  CT head dated 03/03/2016 FINDINGS: Brain: No evidence of acute infarction, hemorrhage, hydrocephalus, extra-axial collection or mass lesion/mass effect. Subcortical white matter and periventricular small vessel ischemic changes. Vascular: Mild intracranial atherosclerosis. Skull: Normal. Negative for fracture  or focal lesion. Sinuses/Orbits: Visualized paranasal sinuses are essentially clear. Partial opacification of the left mastoid air cells. Other: None. IMPRESSION: No evidence of acute intracranial abnormality. Mild small vessel ischemic changes. Electronically Signed   By: Charline Bills M.D.   On: 06/24/2018 11:18   Ct Chest Wo Contrast  Result Date: 06/23/2018 CLINICAL DATA:  Nonproductive cough for 2 days. Low grade fever. EXAM: CT CHEST WITHOUT CONTRAST TECHNIQUE: Multidetector CT imaging of the chest was performed following the standard protocol without IV contrast. COMPARISON:  08/26/2009 FINDINGS: Cardiovascular: Normal heart size. No pericardial  effusions. Coronary artery calcifications. Normal caliber thoracic aorta with scattered calcification. Mediastinum/Nodes: Scattered mediastinal lymph nodes are not pathologically enlarged and are unchanged since prior study, likely reactive. Esophagus is decompressed. Lungs/Pleura: Mild dependent changes in the lung bases. No airspace disease or consolidation. No pleural effusions. No pneumothorax. Airways are patent. Upper Abdomen: No acute abnormality demonstrated. Musculoskeletal: Degenerative changes in the spine. No destructive bone lesions. IMPRESSION: No evidence of active pulmonary disease. Electronically Signed   By: Burman Nieves M.D.   On: 06/23/2018 22:21   Dg Chest Port 1 View  Result Date: 06/25/2018 CLINICAL DATA:  Shortness of breath. EXAM: PORTABLE CHEST 1 VIEW COMPARISON:  Chest radiograph 06/23/2018 FINDINGS: Monitoring leads overlie the patient. Stable cardiomegaly. No consolidative pulmonary opacities. No pleural effusion or pneumothorax. IMPRESSION: No acute cardiopulmonary process. Electronically Signed   By: Annia Belt M.D.   On: 06/25/2018 12:07   Dg Chest Port 1 View  Result Date: 06/23/2018 CLINICAL DATA:  Cough EXAM: PORTABLE CHEST 1 VIEW COMPARISON:  03/03/2016 FINDINGS: Mild cardiomegaly with pulmonary vascular  congestion. No focal airspace consolidation or overt pulmonary edema. No pleural effusion or pneumothorax. IMPRESSION: Cardiomegaly and pulmonary vascular congestion without overt pulmonary edema. Electronically Signed   By: Deatra Robinson M.D.   On: 06/23/2018 18:09     Subjective: Seen and examined at bedside and was doing well. Denied any CP or SOB. Was confused and removed IV overnight. No lightheadedness or dizziness. No other concerns or complaints at this time and is ready to go back to SNF.  Discharge Exam: Vitals:   06/26/18 0756 06/26/18 0817  BP:  120/75  Pulse: 68 70  Resp: 18   Temp:  98.2 F (36.8 C)  SpO2: 96% 94%   Vitals:   06/25/18 2000 06/25/18 2257 06/26/18 0756 06/26/18 0817  BP:  132/77  120/75  Pulse:  61 68 70  Resp:  16 18   Temp:  (!) 97.5 F (36.4 C)  98.2 F (36.8 C)  TempSrc:  Oral  Oral  SpO2: 98% 96% 96% 94%  Weight:      Height:       General: Pt is  awake, not in acute distress Cardiovascular: RRR, S1/S2 +, no rubs, no gallops Respiratory: Diminished bilaterally, no wheezing, no rhonchi Abdominal: Soft, NT, Distended due to body habitus, bowel sounds + Extremities: no edema, no cyanosis  The results of significant diagnostics from this hospitalization (including imaging, microbiology, ancillary and laboratory) are listed below for reference.    Microbiology: Recent Results (from the past 240 hour(s))  Respiratory Panel by PCR     Status: Abnormal   Collection Time: 06/23/18  6:40 PM  Result Value Ref Range Status   Adenovirus NOT DETECTED NOT DETECTED Final   Coronavirus 229E NOT DETECTED NOT DETECTED Final    Comment: (NOTE) The Coronavirus on the Respiratory Panel, DOES NOT test for the novel  Coronavirus (2019 nCoV)    Coronavirus HKU1 NOT DETECTED NOT DETECTED Final   Coronavirus NL63 NOT DETECTED NOT DETECTED Final   Coronavirus OC43 NOT DETECTED NOT DETECTED Final   Metapneumovirus NOT DETECTED NOT DETECTED Final   Rhinovirus /  Enterovirus NOT DETECTED NOT DETECTED Final   Influenza A NOT DETECTED NOT DETECTED Final   Influenza B NOT DETECTED NOT DETECTED Final   Parainfluenza Virus 1 NOT DETECTED NOT DETECTED Final   Parainfluenza Virus 2 NOT DETECTED NOT DETECTED Final   Parainfluenza Virus 3 NOT DETECTED NOT DETECTED Final   Parainfluenza  Virus 4 NOT DETECTED NOT DETECTED Final   Respiratory Syncytial Virus DETECTED (A) NOT DETECTED Final    Comment: CRITICAL RESULT CALLED TO, READ BACK BY AND VERIFIED WITH: M. BROOKS,RN 0552 06/24/2018 T. TYSOR    Bordetella pertussis NOT DETECTED NOT DETECTED Final   Chlamydophila pneumoniae NOT DETECTED NOT DETECTED Final   Mycoplasma pneumoniae NOT DETECTED NOT DETECTED Final    Comment: Performed at [facility], [address], [place], [place] [address]  MRSA PCR Screening     Status: None   Collection Time: 06/24/18 12:22 AM  Result Value Ref Range Status   MRSA by PCR NEGATIVE NEGATIVE Final    Comment:        The GeneXpert MRSA Assay (FDA approved for NASAL specimens only), is one component of a comprehensive MRSA colonization surveillance program. It is not intended to diagnose MRSA infection nor to guide or monitor treatment for MRSA infections. Performed at [facility], [address], [place], [place] [address]     Labs: BNP (last 3 results) No results for input(s): BNP in the last 8760 hours. Basic Metabolic Panel: Recent Labs  Lab 06/23/18 1751 06/24/18 0129 06/25/18 0307  NA 136 136 137  K 4.3 3.8 4.0  CL 105 106 107  CO2 22 21* 23  GLUCOSE 115* 150* 134*  BUN CREATININE 0.96 0.95 0.89  CALCIUM 8.7* 8.6* 8.6*  MG  --   --  2.0  PHOS  --   --  2.9   Liver Function Tests: Recent Labs  Lab 06/23/18 1751 06/24/18 0703 06/25/18 0307  AST 48* 31 27  ALT 51* 39 36  ALKPHOS 80 75 82  BILITOT 1.5* 1.0 0.7  PROT 7.2 6.6 6.8  ALBUMIN 3.5 3.2* 3.2*   No results for input(s): LIPASE, AMYLASE in the last 168  hours. No results for input(s): AMMONIA in the last 168 hours. CBC: Recent Labs  Lab 06/23/18 1751 06/24/18 0129 06/25/18 0307  WBC 12.3* 11.1* 12.1*  NEUTROABS 7.5  --  7.3  HGB 15.9 15.7 15.9  HCT 48.8 45.8 45.9  MCV 92.6 91.6 92.0  PLT 210 166 170   Cardiac Enzymes: Recent Labs  Lab 06/24/18 0129 06/24/18 0703 06/24/18 1325  TROPONINI 0.03* <0.03 <0.03   BNP: Invalid input(s): POCBNP CBG: No results for input(s): GLUCAP in the last 168 hours. D-Dimer No results for input(s): DDIMER in the last 72 hours. Hgb A1c Recent Labs    06/25/18 0307  HGBA1C 6.0*   Lipid Profile No results for input(s): CHOL, HDL, LDLCALC, TRIG, CHOLHDL, LDLDIRECT in the last 72 hours. Thyroid function studies No results for input(s): TSH, T4TOTAL, T3FREE, THYROIDAB in the last 72 hours.  Invalid input(s): FREET3 Anemia work up No results for input(s): VITAMINB12, FOLATE, FERRITIN, TIBC, IRON, RETICCTPCT in the last 72 hours. Urinalysis    Component Value Date/Time   COLORURINE YELLOW 06/25/2018 1039   APPEARANCEUR CLEAR 06/25/2018 1039   LABSPEC 1.029 06/25/2018 1039   PHURINE 5.0 06/25/2018 1039   GLUCOSEU NEGATIVE 06/25/2018 1039   HGBUR NEGATIVE 06/25/2018 1039   BILIRUBINUR NEGATIVE 06/25/2018 1039   KETONESUR NEGATIVE 06/25/2018 1039   PROTEINUR NEGATIVE 06/25/2018 1039   NITRITE NEGATIVE 06/25/2018 1039   LEUKOCYTESUR NEGATIVE 06/25/2018 1039   Sepsis Labs Invalid input(s): PROCALCITONIN,  WBC,  LACTICIDVEN Microbiology Recent Results (from the past 240 hour(s))  Respiratory Panel by PCR     Status: Abnormal   Collection Time: 06/23/18  6:40 PM  Result Value Ref Range Status   Adenovirus NOT DETECTED NOT DETECTED Final   Coronavirus 229E NOT DETECTED NOT DETECTED Final    Comment: (NOTE) The Coronavirus on the Respiratory Panel, DOES NOT test for the novel  Coronavirus (2019 nCoV)    Coronavirus HKU1 NOT DETECTED NOT DETECTED Final   Coronavirus NL63 NOT DETECTED  NOT DETECTED Final   Coronavirus OC43 NOT DETECTED NOT DETECTED Final   Metapneumovirus NOT DETECTED NOT DETECTED Final   Rhinovirus / Enterovirus NOT DETECTED NOT DETECTED Final   Influenza A NOT DETECTED NOT DETECTED Final   Influenza B NOT DETECTED NOT DETECTED Final   Parainfluenza Virus 1 NOT DETECTED NOT DETECTED Final   Parainfluenza Virus 2 NOT DETECTED NOT DETECTED Final   Parainfluenza Virus 3 NOT DETECTED NOT DETECTED Final   Parainfluenza Virus 4 NOT DETECTED NOT DETECTED Final   Respiratory Syncytial Virus DETECTED (A) NOT DETECTED Final    Comment: CRITICAL RESULT CALLED TO, READ BACK BY AND VERIFIED WITH: M. BROOKS,RN 0552 06/24/2018 T. TYSOR    Bordetella pertussis NOT DETECTED NOT DETECTED Final   Chlamydophila pneumoniae NOT DETECTED NOT DETECTED Final   Mycoplasma pneumoniae NOT DETECTED NOT DETECTED Final    Comment: Performed at [facility], [address], [place], [place] [address]  MRSA PCR Screening     Status: None   Collection Time: 06/24/18 12:22 AM  Result Value Ref Range Status   MRSA by PCR NEGATIVE NEGATIVE Final    Comment:        The GeneXpert MRSA Assay (FDA approved for NASAL specimens only), is one component of a comprehensive MRSA colonization surveillance program. It is not intended to diagnose MRSA infection nor to guide or monitor treatment for MRSA infections. Performed at [facility], [address], [place], [place] [address]    Time coordinating discharge: 35 minutes  SIGNED:  Merlene Laughter, DO [facility] 06/26/2018, 11:59 AM Pager is on AMION  If 7PM-7AM, please contact night-coverage [email or web address] Password [email or web address]

## 2018-06-26 NOTE — TOC Transition Note (Signed)
Transition of Care [facility]) - CM/SW Discharge Note   Patient Details  Name: MICCAH COCKERELL MRN: [id number] Date of Birth: Jun 07, 1940  [facility] CM/SW Contact:  Maree Krabbe, LCSW Phone Number: 06/26/2018, 1:41 PM   Clinical Narrative:    Clinical Social Worker facilitated patient discharge including contacting patient family and facility to confirm patient discharge plans.  Clinical information faxed to facility and family agreeable with plan.  Pt's daughter will transport pt to [facility].  RN to call [phone] for report prior to discharge.  Clinical Social Worker will sign off for now as social work intervention is no longer needed. Please consult [place] again if new need arises.     Final next level of care: Other (comment)(Assisted Living Facilty) Barriers to Discharge: No Barriers Identified   Patient Goals and CMS Choice        Discharge Placement              Patient chooses bed at: [facility] Patient to be transferred to facility by: Daughter Name of family member notified: RN to contact daughter when pt is ready Patient and family notified of of transfer: 06/26/18  Discharge Plan and Services                        Social Determinants of Health (SDOH) Interventions     Readmission Risk Interventions No flowsheet data found.

## 2018-06-26 NOTE — Progress Notes (Signed)
Pt refusing lab work and pulled out his INT.  Becoming aggitated upon attempt to replace. INT.  INT remains out at this time.  Orientated to self and place.  States he will let the lab draw blood befor he leasves in the morning.

## 2018-06-26 NOTE — Progress Notes (Signed)
Daily Nursing Note  Received sign out from night RN, Darl Pikes who endorsed that the patient overall had a good night with the exception of pulling out his PIV and refusing lab draws.IV steroids were transitioned to PO and administered to patient in AM. Patient updated on the plan for discharge though given his altered mental state he is unable to comprehend this. Spoke with patients daughter, Camelia Eng and provided discharge paperwork in person. Patient escorted to car w/o any complaints.

## 2018-06-26 NOTE — NC FL2 (Addendum)
[place] MEDICAID FL2 LEVEL OF CARE SCREENING TOOL     IDENTIFICATION  Patient Name: CLAUDE KIL Birthdate: [birth year] Sex: male Admission Date (Current Location): 06/23/2018  [facility] and [place] Number:  Producer, television/film/video and Address:  [facility], [address] [place], [place] [address]      Provider Number: [id number]  Attending Physician Name and Address:  Merlene Laughter, DO  Relative Name and Phone Number:       Current Level of Care: Hospital Recommended Level of Care: Assisted Living Facility Prior Approval Number:    Date Approved/Denied:   PASRR Number:    Discharge Plan: Assisted living facility   Current Diagnoses: Patient Active Problem List   Diagnosis Date Noted  . Acute respiratory failure with hypoxia (HCC) 06/23/2018  . 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 Delirium   . Late onset Alzheimer's disease with behavioral disturbance (HCC)   . Fall   . Essential hypertension   . TIA (transient ischemic attack) 04/27/2017  . CVA (cerebral vascular accident) (HCC) 04/27/2017  . 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 CAD (coronary artery disease) 10/26/2010  . Hyperlipidemia 10/26/2010  . SHORTNESS OF BREATH 07/18/2009    Orientation RESPIRATION BLADDER Height & Weight     Self  Normal Incontinent Weight: 249 lb 1.9 oz (113 kg) Height:  6' 2.5" (189.2 cm)  BEHAVIORAL SYMPTOMS/MOOD NEUROLOGICAL BOWEL NUTRITION STATUS      Continent Mechanical soft diet  AMBULATORY STATUS COMMUNICATION OF NEEDS Skin   Limited Assist Verbally Normal                       Personal Care Assistance Level of Assistance  Bathing, Feeding, Dressing Bathing Assistance: Limited assistance Feeding assistance: Independent Dressing Assistance: Limited assistance     Functional Limitations Info  Sight, Hearing, Speech Sight Info: Adequate Hearing Info: Adequate Speech Info: Adequate    SPECIAL CARE FACTORS FREQUENCY  PT (By licensed  PT), OT (By licensed OT)     PT Frequency: eval to treat OT Frequency: eval to treat RN Frequency: eval to treat            Contractures Contractures Info: Not present    Additional Factors Info  Code Status, Allergies Code Status Info: Full Code Allergies Info: No known allergies           Current Medications (06/26/2018):  This is the current hospital active medication list Current Facility-Administered Medications  Medication Dose Route Frequency Provider Last Rate Last Dose  . acetaminophen (TYLENOL) tablet 650 mg  650 mg Oral Q6H PRN Eduard Clos, MD       Or  . acetaminophen (TYLENOL) suppository 650 mg  650 mg Rectal Q6H PRN Eduard Clos, MD      . albuterol (PROVENTIL) (2.5 MG/3ML) 0.083% nebulizer solution 2.5 mg  2.5 mg Nebulization Q2H PRN Eduard Clos, MD      . aspirin chewable tablet 81 mg  81 mg Oral Daily Eduard Clos, MD   81 mg at 06/26/18 0724  . atorvastatin (LIPITOR) tablet 40 mg  40 mg Oral Daily Eduard Clos, MD   40 mg at 06/26/18 0724  . budesonide (PULMICORT) nebulizer solution 0.25 mg  0.25 mg Nebulization BID Eduard Clos, MD   0.25 mg at 06/26/18 0754  . clopidogrel (PLAVIX) tablet 75 mg  75 mg Oral Daily Eduard Clos, MD   75 mg at 06/26/18 5093  .  divalproex (DEPAKOTE) DR tablet 500 mg  500 mg Oral QHS Marguerita Merles [place], DO   500 mg at 06/25/18 2217  . donepezil (ARICEPT) tablet 10 mg  10 mg Oral QHS Eduard Clos, MD   10 mg at 06/25/18 2218  . enoxaparin (LOVENOX) injection 40 mg  40 mg Subcutaneous Q24H Eduard Clos, MD   Stopped at 06/26/18 [phone]  . losartan (COZAAR) tablet 50 mg  50 mg Oral Daily Marguerita Merles [place], DO   50 mg at 06/26/18 6578  . ondansetron (ZOFRAN) tablet 4 mg  4 mg Oral Q6H PRN Eduard Clos, MD       Or  . ondansetron [facility]) injection 4 mg  4 mg Intravenous Q6H PRN Eduard Clos, MD      . pantoprazole (PROTONIX) EC tablet 40 mg  40  mg Oral Q1200 Eduard Clos, MD   40 mg at 06/26/18 0723  . [START ON 06/27/2018] predniSONE (DELTASONE) tablet 60 mg  60 mg Oral Q breakfast Marguerita Merles [place], DO   60 mg at 06/26/18 0830  . senna-docusate (Senokot-S) tablet 1 tablet  1 tablet Oral PRN Eduard Clos, MD         Discharge Medications: Please see discharge summary for a list of discharge medications.  Relevant Imaging Results:  Relevant Lab Results:   Additional Information SSN: [id number]  Maree Krabbe, LCSW

## 2018-06-27 LAB — URINE CULTURE: Culture: <10000 — AB

## 2018-08-22 ENCOUNTER — Encounter (HOSPITAL_COMMUNITY): Payer: Self-pay

## 2018-08-22 ENCOUNTER — Other Ambulatory Visit: Payer: Self-pay

## 2018-08-22 ENCOUNTER — Emergency Department (HOSPITAL_COMMUNITY): Payer: Medicare Other

## 2018-08-22 ENCOUNTER — Emergency Department (HOSPITAL_COMMUNITY)
Admission: EM | Admit: 2018-08-22 | Discharge: 2018-08-23 | Disposition: A | Discharge status: Home / Self Care | Payer: Medicare Other | Attending: Emergency Medicine | Admitting: Emergency Medicine

## 2018-08-22 DIAGNOSIS — G309 Alzheimer's disease, unspecified: Secondary | ICD-10-CM | POA: Insufficient documentation

## 2018-08-22 DIAGNOSIS — Z79899 Other long term (current) drug therapy: Secondary | ICD-10-CM | POA: Diagnosis not present

## 2018-08-22 DIAGNOSIS — R4182 Altered mental status, unspecified: Secondary | ICD-10-CM | POA: Diagnosis present

## 2018-08-22 DIAGNOSIS — E876 Hypokalemia: Secondary | ICD-10-CM

## 2018-08-22 DIAGNOSIS — I2510 Atherosclerotic heart disease of native coronary artery without angina pectoris: Secondary | ICD-10-CM | POA: Diagnosis not present

## 2018-08-22 DIAGNOSIS — Z8673 Personal history of transient ischemic attack (TIA), and cerebral infarction without residual deficits: Secondary | ICD-10-CM | POA: Diagnosis not present

## 2018-08-22 DIAGNOSIS — Z96642 Presence of left artificial hip joint: Secondary | ICD-10-CM | POA: Insufficient documentation

## 2018-08-22 DIAGNOSIS — I10 Essential (primary) hypertension: Secondary | ICD-10-CM | POA: Diagnosis not present

## 2018-08-22 DIAGNOSIS — Z7982 Long term (current) use of aspirin: Secondary | ICD-10-CM | POA: Insufficient documentation

## 2018-08-22 DIAGNOSIS — Z7902 Long term (current) use of antithrombotics/antiplatelets: Secondary | ICD-10-CM | POA: Diagnosis not present

## 2018-08-22 DIAGNOSIS — F0280 Dementia in other diseases classified elsewhere without behavioral disturbance: Secondary | ICD-10-CM | POA: Insufficient documentation

## 2018-08-22 LAB — AMMONIA: Ammonia: 17 umol/L (ref 9–35)

## 2018-08-22 LAB — URINALYSIS, ROUTINE W REFLEX MICROSCOPIC
Bilirubin Urine: NEGATIVE
Glucose, UA: NEGATIVE mg/dL
Hgb urine dipstick: NEGATIVE
Ketones, ur: NEGATIVE mg/dL
Leukocytes,Ua: NEGATIVE
Nitrite: NEGATIVE
Protein, ur: NEGATIVE mg/dL
Specific Gravity, Urine: 1.021 (ref 1.005–1.030)
pH: 5 (ref 5.0–8.0)

## 2018-08-22 LAB — COMPREHENSIVE METABOLIC PANEL
ALT: 28 U/L (ref 0–44)
AST: 28 U/L (ref 15–41)
Albumin: 2.7 g/dL — ABNORMAL LOW (ref 3.5–5.0)
Alkaline Phosphatase: 101 U/L (ref 38–126)
Anion gap: 6 (ref 5–15)
BUN: 12 mg/dL (ref 8–23)
CO2: 22 mmol/L (ref 22–32)
Calcium: 7.6 mg/dL — ABNORMAL LOW (ref 8.9–10.3)
Chloride: 109 mmol/L (ref 98–111)
Creatinine, Ser: 0.77 mg/dL (ref 0.61–1.24)
GFR calc Af Amer: >60 mL/min (ref >60)
GFR calc non Af Amer: >60 mL/min (ref >60)
Glucose, Bld: 104 mg/dL — ABNORMAL HIGH (ref 70–99)
Potassium: 3.2 mmol/L — ABNORMAL LOW (ref 3.5–5.1)
Sodium: 137 mmol/L (ref 135–145)
Total Bilirubin: 1.1 mg/dL (ref 0.3–1.2)
Total Protein: 5.7 g/dL — ABNORMAL LOW (ref 6.5–8.1)

## 2018-08-22 LAB — CBC WITH DIFFERENTIAL/PLATELET
Abs Immature Granulocytes: 0.06 10*3/uL (ref 0.00–0.07)
Basophils Absolute: 0.1 10*3/uL (ref 0.0–0.1)
Basophils Relative: 1 %
Eosinophils Absolute: 0.2 10*3/uL (ref 0.0–0.5)
Eosinophils Relative: 2 %
HCT: 39.9 % (ref 39.0–52.0)
Hemoglobin: 13.3 g/dL (ref 13.0–17.0)
Immature Granulocytes: 1 %
Lymphocytes Relative: 28 %
Lymphs Abs: 3.3 10*3/uL (ref 0.7–4.0)
MCH: 31.3 pg (ref 26.0–34.0)
MCHC: 33.3 g/dL (ref 30.0–36.0)
MCV: 93.9 fL (ref 80.0–100.0)
Monocytes Absolute: 1.3 10*3/uL — ABNORMAL HIGH (ref 0.1–1.0)
Monocytes Relative: 11 %
Neutro Abs: 7 10*3/uL (ref 1.7–7.7)
Neutrophils Relative %: 57 %
Platelets: 194 10*3/uL (ref 150–400)
RBC: 4.25 MIL/uL (ref 4.22–5.81)
RDW: 14.4 % (ref 11.5–15.5)
WBC: 11.9 10*3/uL — ABNORMAL HIGH (ref 4.0–10.5)
nRBC: 0 % (ref 0.0–0.2)

## 2018-08-22 LAB — VALPROIC ACID LEVEL: Valproic Acid Lvl: <10 ug/mL — ABNORMAL LOW (ref 50.0–100.0)

## 2018-08-22 MED ORDER — LORAZEPAM 2 MG/ML IJ SOLN
0.5000 mg | Freq: Once | INTRAMUSCULAR | Status: AC
  Administered 2018-08-22: 21:00:00 0.5 mg via INTRAVENOUS
  Filled 2018-08-22: qty 1

## 2018-08-22 MED ORDER — POTASSIUM CHLORIDE CRYS ER 20 MEQ PO TBCR
40.0000 meq | EXTENDED_RELEASE_TABLET | Freq: Once | ORAL | Status: AC
  Administered 2018-08-22: 40 meq via ORAL
  Filled 2018-08-22: qty 2

## 2018-08-22 NOTE — ED Triage Notes (Signed)
Pt BIB [facility] for eval of AMS onset x 1 week w/ gradual decline, worst today. Staff reports pt is unable to state year or situation, which is unlike himself. He is normally A&Ox4 at baseline. EMS also reports some hematuria

## 2018-08-22 NOTE — ED Notes (Signed)
Pt daughter called me back.  She states that the facility had told her that her father has been declining and has had a lack of appetite which is very unusual for him, she states that he has been more fatigued and she is concerned that they told her that he was slumping when sitting in the chair.  She is not aware to to which side (Pt seems to be slumping to the right here on the stretcher).  After speaking with pt on the phone daughter tells me that pts voice is changed, she states that it is "raspier and more difficutly speaking".  Pt is resting, lights dimmed for comfort.

## 2018-08-22 NOTE — Discharge Instructions (Signed)
Your work-up today was only significant for low potassium and slightly low calcium as you had in the past.  The MRI did not show any evidence of acute stroke with the concern for possible speech change and the right-sided weakness.  We did not find evidence of infection in your lungs or urine.  Your other labs were overall reassuring.  Given your reassuring work-up and our shared decision made conversation with you feeling that your symptoms are gone, we feel you are safe for discharge home.  Please follow-up with your primary care physician for further management.  If any symptoms change or worsen, please return to the nearest emergency department.

## 2018-08-22 NOTE — ED Notes (Signed)
Report called to [employer].  Pt to be transported

## 2018-08-22 NOTE — ED Notes (Signed)
Called [facility] for pt  

## 2018-08-22 NOTE — ED Notes (Signed)
Daughter would like to make sure that if pt requires a MRI he would need some pre-medication due to claustrophobia

## 2018-08-22 NOTE — ED Provider Notes (Signed)
3:27 PM Care assumed from Dr. Rubin Payor.  At time of transfer care, patient is awaiting results of diagnostic laboratory testing and imaging.  Patient reportedly is not his mental status baseline according to the facility report.  Patient has concern for urinary tract infection.  Patient awaiting CT head and urinalysis.  Patient also was waiting ammonia and Depakote level.  As patient is not at his baseline mental status per facility, anticipate patient require admission.  4:38 PM Patient's diagnostic labs show mild hypokalemia and hypocalcemia but otherwise had normal kidney function liver function.  Mild leukocytosis but no anemia.  Urinalysis does not show evidence of infection, Depakote level was undetectable, and ammonia was normal.  CT head initially shows no acute abnormality but does show some chronic microvascular ischemic changes.  Chest x-ray shows no pneumonia.  The daughter called in to report that she is concerned that her father's voice appears to have changed and is slightly more slurred compared to his baseline.  When I went to examine the patient, his speech seemed clear but the daughter reportedly feels that it is different.  Also concerning as she is concerned the patient has been slumping to the right and is having right-sided weakness.  I also examined the patient and he did have a right arm pronator drift compared to left which he reports is new for the last 2 days.  He reports his right side seems weak in his right arm and right leg.  His right leg had normal strength compared to the left on my exam.  Given the patient's family's report of voice change and the patient's right arm weakness compared to the left on my examination, patient will have MRI to look for stroke.  Daughter thinks the patient may need some medication to help with claustrophobia for the MRI, Ativan will be ordered to use if patient needs it.  10:55 PM Patient's MRI shows no stroke.  Only labs showed mild  hypokalemia with potassium was ordered to replace.  Urinalysis shows no infection.  Patient was reassessed and he reports feeling much better.  He does not feel he has any confusion and he thinks his speech is completely normal.  He wants to go home.  Given his reassuring work-up, I do not find any criteria for admission at this time.  Patient will be discharged back to his facility.     Clinical Impression: 1. Altered mental status, unspecified altered mental status type   2. Hypokalemia     Disposition: Discharge  Condition: Good  I have discussed the results, Dx and Tx plan with the pt(& family if present). He/she/they expressed understanding and agree(s) with the plan. Discharge instructions discussed at great length. Strict return precautions discussed and pt &/or family have verbalized understanding of the instructions. No further questions at time of discharge.    New Prescriptions   No medications on file    Follow Up: Lauro Regulus, MD [address] [place] [facility] [place] [address] [phone]     [facility] EMERGENCY DEPARTMENT [address] [id number] [place] [address] [phone]        Markel Mergenthaler, Canary Brim, MD 08/23/18 [place]

## 2018-08-22 NOTE — ED Notes (Signed)
Nurse Navigator called daughter per pt request, no answer.  Sent text so she would be able to reach [place] for updates.

## 2018-08-22 NOTE — ED Provider Notes (Signed)
[facility] EMERGENCY DEPARTMENT Provider Note   CSN: [id number] Arrival date & time: 08/22/18  1348    History   Chief Complaint Chief Complaint  Patient presents with  . Altered Mental Status    HPI Kelly [name] is a 78 y.o. male.    Level 5 caveat due to altered mental status. HPI Patient presents with altered mental status.  Reportedly more confused.  Reported history of dementia but for this is more demented.  Per EMS reportedly alert and oriented x4 baseline.  Patient is unable to tell me what the year is.  Per EMS staff thought there could have been an unwitnessed fall although does not sound as if they found him on the floor. Past Medical History:  Diagnosis Date  . Dementia (HCC)   . Hypertension   . Hypertriglyceridemia   . Stroke  [facility])     Patient Active Problem List   Diagnosis Date Noted  . Acute respiratory failure with hypoxia (HCC) 06/23/2018  . 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 Delirium   . Late onset Alzheimer's disease with behavioral disturbance (HCC)   . Fall   . Essential hypertension   . TIA (transient ischemic attack) 04/27/2017  . CVA (cerebral vascular accident) (HCC) 04/27/2017  . 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 CAD (coronary artery disease) 10/26/2010  . Hyperlipidemia 10/26/2010  . SHORTNESS OF BREATH 07/18/2009    Past Surgical History:  Procedure Laterality Date  . HAND SURGERY    . TOTAL HIP ARTHROPLASTY     Left        Home Medications    Prior to Admission medications   Medication Sig Start Date End Date Taking? Authorizing Provider  albuterol (PROVENTIL) (2.5 MG/3ML) 0.083% nebulizer solution Take 3 mLs (2.5 mg total) by nebulization every 2 (two) hours as needed for wheezing. 06/26/18   Marguerita Merles Latif, DO  aspirin 81 MG chewable tablet Chew 1 tablet (81 mg total) by mouth daily. 09/14/17   Antonieta Iba, MD  atorvastatin (LIPITOR) 40 MG tablet Take 40 mg by mouth at bedtime.     [provider]  clopidogrel (PLAVIX) 75 MG tablet Take 1 tablet (75 mg total) by mouth daily. 09/14/17   Antonieta Iba, MD  clotrimazole (LOTRIMIN) 1 % cream Apply 1 application topically 2 (two) times daily as needed (rash).     [provider]  divalproex (DEPAKOTE) 250 MG DR tablet Take 500 mg by mouth at bedtime.    [provider]  donepezil (ARICEPT) 10 MG tablet Take 10 mg by mouth at bedtime. 03/15/17   [provider]  losartan (COZAAR) 50 MG tablet Take 50 mg by mouth daily.    [provider]  nystatin (NYSTATIN) powder Apply topically 3 (three) times daily as needed (rash).    [provider]  ondansetron (ZOFRAN) 4 MG tablet Take 1 tablet (4 mg total) by mouth every 6 (six) hours as needed for nausea. 06/26/18   Sheikh, Omair Latif, DO  pantoprazole (PROTONIX) 40 MG tablet Take 1 tablet (40 mg total) by mouth daily at 12 noon. 06/26/18   Sheikh, Omair Latif, DO  predniSONE (STERAPRED UNI-PAK 21 TAB) 10 MG (21) TBPK tablet Take 6 Tablets on Day 1, 5 Tablets on Day 2, 4 Tablets on Day 3, 3 Tablets on Day 4, 2 Tablets on Day 5, 1 Tablet on Day 6, and then Stop Day 7 06/26/18   Sheikh, [place] Latif, DO  sennosides-docusate sodium (SENOKOT-S)  8.6-50 MG tablet Take 1 tablet by mouth at bedtime as needed for constipation.     [provider]    Family History Family History  Problem Relation Age of Onset  . Heart failure Father   . Hypertension Father     Social History Social History   Tobacco Use  . Smoking status: Never Smoker  . Smokeless tobacco: Never Used  Substance Use Topics  . Alcohol use: No  . Drug use: No     Allergies   Patient has no known allergies.   Review of Systems Review of Systems  Unable to perform ROS: Dementia     Physical Exam Updated Vital Signs BP 135/84 (BP Location: Right Arm)   Pulse 78   Temp 97.9 F (36.6 C) (Oral)   Resp 20   Ht 6\' 2"  (1.88 m)   Wt 113 kg   SpO2 98%   BMI  31.99 kg/m   Physical Exam Vitals signs and nursing note reviewed.  Constitutional:      Appearance: Normal appearance.  HENT:     Head: Atraumatic.  Eyes:     Pupils: Pupils are equal, round, and reactive to light.  Neck:     Musculoskeletal: Neck supple.  Cardiovascular:     Rate and Rhythm: Normal rate and regular rhythm.  Pulmonary:     Breath sounds: No wheezing, rhonchi or rales.  Abdominal:     Tenderness: There is no abdominal tenderness.  Musculoskeletal:        General: No signs of injury.  Skin:    General: Skin is warm.     Capillary Refill: Capillary refill takes less than 2 seconds.  Neurological:     Comments: Awake and will answer questions, but some confusion.  Cannot tell me what year it is but is able to [facility] me [facility]      ED Treatments / Results  Labs (all labs ordered are listed, but only abnormal results are displayed) Labs Reviewed  COMPREHENSIVE METABOLIC PANEL - Abnormal; Notable for the following components:      Result Value   Potassium 3.2 (*)    Glucose, Bld 104 (*)    Calcium 7.6 (*)    Total Protein 5.7 (*)    Albumin 2.7 (*)    All other components within normal limits  CBC WITH DIFFERENTIAL/PLATELET - Abnormal; Notable for the following components:   WBC 11.9 (*)    Monocytes Absolute 1.3 (*)    All other components within normal limits  URINALYSIS, ROUTINE W REFLEX MICROSCOPIC  VALPROIC ACID LEVEL  AMMONIA    EKG None  Radiology Dg Chest Portable 1 View  Result Date: 08/22/2018 CLINICAL DATA:  Altered mental status for the past week. EXAM: PORTABLE CHEST 1 VIEW COMPARISON:  Chest x-ray dated June 25, 2018. FINDINGS: Stable cardiomediastinal silhouette with normal heart size. Normal pulmonary vascularity. Low lung volumes. No focal consolidation, pleural effusion, or pneumothorax. No acute osseous abnormality. IMPRESSION: No active disease. Electronically Signed   By: Obie DredgeWilliam T Derry M.D.   On:  08/22/2018 14:38    Procedures Procedures (including critical care time)  Medications Ordered in ED Medications - No data to display   Initial Impression / Assessment and Plan / ED Course  I have reviewed the triage vital signs and the nursing notes.  Pertinent labs & imaging results that were available during my care of the patient were reviewed by me and considered in my medical decision  making (see chart for details).        Patient with mental status change.  From nursing home.  Labs and work-up pending.  Care will be turned over to Dr. Rush Landmark.  Final Clinical Impressions(s) / ED Diagnoses   Final diagnoses:  Altered mental status, unspecified altered mental status type    ED Discharge Orders    None       Benjiman Core, MD 08/22/18 1512

## 2018-08-23 NOTE — ED Notes (Signed)
Awaiting [facility] transport. Pt sleeping. No distress noted.

## 2018-08-23 NOTE — ED Notes (Signed)
[facility] here for transport. 

## 2018-09-05 ENCOUNTER — Emergency Department (HOSPITAL_COMMUNITY): Payer: Medicare Other

## 2018-09-05 ENCOUNTER — Encounter (HOSPITAL_COMMUNITY): Payer: Self-pay | Admitting: Emergency Medicine

## 2018-09-05 ENCOUNTER — Emergency Department (HOSPITAL_COMMUNITY)
Admission: EM | Admit: 2018-09-05 | Discharge: 2018-09-05 | Disposition: A | Discharge status: Home / Self Care | Payer: Medicare Other | Attending: Emergency Medicine | Admitting: Emergency Medicine

## 2018-09-05 ENCOUNTER — Other Ambulatory Visit: Payer: Self-pay

## 2018-09-05 DIAGNOSIS — R531 Weakness: Secondary | ICD-10-CM | POA: Diagnosis not present

## 2018-09-05 DIAGNOSIS — I10 Essential (primary) hypertension: Secondary | ICD-10-CM | POA: Insufficient documentation

## 2018-09-05 DIAGNOSIS — I2510 Atherosclerotic heart disease of native coronary artery without angina pectoris: Secondary | ICD-10-CM | POA: Diagnosis not present

## 2018-09-05 DIAGNOSIS — G309 Alzheimer's disease, unspecified: Secondary | ICD-10-CM | POA: Insufficient documentation

## 2018-09-05 DIAGNOSIS — Z96642 Presence of left artificial hip joint: Secondary | ICD-10-CM | POA: Insufficient documentation

## 2018-09-05 DIAGNOSIS — Z79899 Other long term (current) drug therapy: Secondary | ICD-10-CM | POA: Insufficient documentation

## 2018-09-05 DIAGNOSIS — Z7982 Long term (current) use of aspirin: Secondary | ICD-10-CM | POA: Insufficient documentation

## 2018-09-05 DIAGNOSIS — F0280 Dementia in other diseases classified elsewhere without behavioral disturbance: Secondary | ICD-10-CM | POA: Diagnosis not present

## 2018-09-05 DIAGNOSIS — Z8673 Personal history of transient ischemic attack (TIA), and cerebral infarction without residual deficits: Secondary | ICD-10-CM | POA: Diagnosis not present

## 2018-09-05 DIAGNOSIS — Z1159 Encounter for screening for other viral diseases: Secondary | ICD-10-CM | POA: Insufficient documentation

## 2018-09-05 LAB — CBC WITH DIFFERENTIAL/PLATELET
Abs Immature Granulocytes: 0.08 10*3/uL — ABNORMAL HIGH (ref 0.00–0.07)
Basophils Absolute: 0.1 10*3/uL (ref 0.0–0.1)
Basophils Relative: 1 %
Eosinophils Absolute: 0.3 10*3/uL (ref 0.0–0.5)
Eosinophils Relative: 2 %
HCT: 44.3 % (ref 39.0–52.0)
Hemoglobin: 14.4 g/dL (ref 13.0–17.0)
Immature Granulocytes: 1 %
Lymphocytes Relative: 29 %
Lymphs Abs: 3.1 10*3/uL (ref 0.7–4.0)
MCH: 30.6 pg (ref 26.0–34.0)
MCHC: 32.5 g/dL (ref 30.0–36.0)
MCV: 94.1 fL (ref 80.0–100.0)
Monocytes Absolute: 1 10*3/uL (ref 0.1–1.0)
Monocytes Relative: 10 %
Neutro Abs: 6.1 10*3/uL (ref 1.7–7.7)
Neutrophils Relative %: 57 %
Platelets: 253 10*3/uL (ref 150–400)
RBC: 4.71 MIL/uL (ref 4.22–5.81)
RDW: 14.6 % (ref 11.5–15.5)
WBC: 10.7 10*3/uL — ABNORMAL HIGH (ref 4.0–10.5)
nRBC: 0 % (ref 0.0–0.2)

## 2018-09-05 LAB — URINALYSIS, ROUTINE W REFLEX MICROSCOPIC
Bilirubin Urine: NEGATIVE
Glucose, UA: NEGATIVE mg/dL
Hgb urine dipstick: NEGATIVE
Ketones, ur: NEGATIVE mg/dL
Leukocytes,Ua: NEGATIVE
Nitrite: NEGATIVE
Protein, ur: NEGATIVE mg/dL
Specific Gravity, Urine: 1.016 (ref 1.005–1.030)
pH: 6 (ref 5.0–8.0)

## 2018-09-05 LAB — TSH: TSH: 3.606 u[IU]/mL (ref 0.350–4.500)

## 2018-09-05 LAB — T4, FREE: Free T4: 1.03 ng/dL (ref 0.82–1.77)

## 2018-09-05 LAB — COMPREHENSIVE METABOLIC PANEL
ALT: 37 U/L (ref 0–44)
AST: 33 U/L (ref 15–41)
Albumin: 3.2 g/dL — ABNORMAL LOW (ref 3.5–5.0)
Alkaline Phosphatase: 149 U/L — ABNORMAL HIGH (ref 38–126)
Anion gap: 8 (ref 5–15)
BUN: 15 mg/dL (ref 8–23)
CO2: 25 mmol/L (ref 22–32)
Calcium: 8.5 mg/dL — ABNORMAL LOW (ref 8.9–10.3)
Chloride: 103 mmol/L (ref 98–111)
Creatinine, Ser: 0.82 mg/dL (ref 0.61–1.24)
GFR calc Af Amer: >60 mL/min (ref >60)
GFR calc non Af Amer: >60 mL/min (ref >60)
Glucose, Bld: 105 mg/dL — ABNORMAL HIGH (ref 70–99)
Potassium: 3.7 mmol/L (ref 3.5–5.1)
Sodium: 136 mmol/L (ref 135–145)
Total Bilirubin: 0.4 mg/dL (ref 0.3–1.2)
Total Protein: 6.9 g/dL (ref 6.5–8.1)

## 2018-09-05 LAB — SARS CORONAVIRUS 2 BY RT PCR (HOSPITAL ORDER, PERFORMED IN ~~LOC~~ HOSPITAL LAB): SARS Coronavirus 2: NEGATIVE

## 2018-09-05 LAB — LACTIC ACID, PLASMA: Lactic Acid, Venous: 1.1 mmol/L (ref 0.5–1.9)

## 2018-09-05 NOTE — ED Notes (Signed)
XR at bedside

## 2018-09-05 NOTE — ED Notes (Signed)
Pt found getting out of bed and had removed cardiac monitoring, bp cuff and oxygen probe. Pt repositioned in bed with cardiac monitoring re-applied. Pt asking for MD to come to room and discharge pt. Pt asked to not get out of bed without assistance and to use call bell if help is needed. Pt has call bell within reach.

## 2018-09-05 NOTE — ED Provider Notes (Signed)
[facility]-EMERGENCY DEPT Provider Note   CSN: [id number] Arrival date & time: 09/05/18  1449    History   Chief Complaint Chief Complaint  Patient presents with  . Weakness    HPI Kelly [name] is a 78 y.o. male.     78 yo M with a chief complaints of generalized weakness.  Patient has been less active than normal for the past couple days per the nursing home.  Call the daughter requested he be transferred here for evaluation.  Patient denies other complaint denies cough congestion fever denies chest pain denies abdominal pain denies nausea vomiting or diarrhea.    Was recently started on antibiotics for possible urinary tract infection.  The history is provided by the patient.  Weakness  Severity:  Moderate Onset quality:  Sudden Duration:  2 days Timing:  Constant Progression:  Worsening Chronicity:  New Relieved by:  Nothing Worsened by:  Nothing Ineffective treatments:  None tried Associated symptoms: no abdominal pain, no arthralgias, no chest pain, no diarrhea, no fever, no headaches, no myalgias, no shortness of breath and no vomiting     Past Medical History:  Diagnosis Date  . Dementia (HCC)   . Hypertension   . Hypertriglyceridemia   . Stroke [facility](HCC)     Patient Active Problem List   Diagnosis Date Noted  . Acute respiratory failure with hypoxia (HCC) 06/23/2018  . 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 Delirium   . Late onset Alzheimer's disease with behavioral disturbance (HCC)   . Fall   . Essential hypertension   . TIA (transient ischemic attack) 04/27/2017  . CVA (cerebral vascular accident) (HCC) 04/27/2017  . 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 CAD (coronary artery disease) 10/26/2010  . Hyperlipidemia 10/26/2010  . SHORTNESS OF BREATH 07/18/2009    Past Surgical History:  Procedure Laterality Date  . HAND SURGERY    . TOTAL HIP ARTHROPLASTY     Left        Home Medications    Prior to Admission medications    Medication Sig Start Date End Date Taking? Authorizing Provider  albuterol (PROVENTIL) (2.5 MG/3ML) 0.083% nebulizer solution Take 3 mLs (2.5 mg total) by nebulization every 2 (two) hours as needed for wheezing. 06/26/18  Yes Sheikh, Omair Latif, DO  amoxicillin (AMOXIL) 875 MG tablet Take 875 mg by mouth 2 (two) times daily.   Yes [provider]  aspirin 81 MG chewable tablet Chew 1 tablet (81 mg total) by mouth daily. 09/14/17  Yes Antonieta IbaGollan, Timothy J, MD  atorvastatin (LIPITOR) 40 MG tablet Take 40 mg by mouth at bedtime.    Yes [provider]  clopidogrel (PLAVIX) 75 MG tablet Take 1 tablet (75 mg total) by mouth daily. 09/14/17  Yes Antonieta IbaGollan, Timothy J, MD  divalproex (DEPAKOTE) 250 MG DR tablet Take 500 mg by mouth at bedtime.   Yes [provider]  donepezil (ARICEPT) 10 MG tablet Take 10 mg by mouth at bedtime. 03/15/17  Yes [provider]  losartan (COZAAR) 50 MG tablet Take 50 mg by mouth daily.   Yes [provider]  nystatin (NYSTATIN) powder Apply topically 3 (three) times daily as needed (rash).   Yes [provider]  ondansetron (ZOFRAN) 4 MG tablet Take 1 tablet (4 mg total) by mouth every 6 (six) hours as needed for nausea. 06/26/18  Yes Sheikh, Omair Latif, DO  sennosides-docusate sodium (SENOKOT-S) 8.6-50 MG tablet Take 1 tablet by mouth at bedtime as needed for constipation.  Yes [provider]    Family History Family History  Problem Relation Age of Onset  . Heart failure Father   . Hypertension Father     Social History Social History   Tobacco Use  . Smoking status: Never Smoker  . Smokeless tobacco: Never Used  Substance Use Topics  . Alcohol use: No  . Drug use: No     Allergies   Patient has no known allergies.   Review of Systems Review of Systems  Constitutional: Negative for chills and fever.  HENT: Negative for congestion and facial swelling.   Eyes: Negative for discharge and visual  disturbance.  Respiratory: Negative for shortness of breath.   Cardiovascular: Negative for chest pain and palpitations.  Gastrointestinal: Negative for abdominal pain, diarrhea and vomiting.  Musculoskeletal: Negative for arthralgias and myalgias.  Skin: Negative for color change and rash.  Neurological: Positive for weakness. Negative for tremors, syncope and headaches.  Psychiatric/Behavioral: Negative for confusion and dysphoric mood.     Physical Exam Updated Vital Signs BP (!) 115/96   Pulse 80   Temp 98.2 F (36.8 C) (Oral)   Resp 16   SpO2 97%   Physical Exam Vitals signs and nursing note reviewed.  Constitutional:      Appearance: He is well-developed.  HENT:     Head: Normocephalic and atraumatic.  Eyes:     Pupils: Pupils are equal, round, and reactive to light.  Neck:     Musculoskeletal: Normal range of motion and neck supple.     Vascular: No JVD.  Cardiovascular:     Rate and Rhythm: Normal rate and regular rhythm.     Heart sounds: No murmur. No friction rub. No gallop.   Pulmonary:     Effort: No respiratory distress.     Breath sounds: No wheezing.  Abdominal:     General: There is no distension.     Tenderness: There is no guarding or rebound.  Musculoskeletal: Normal range of motion.  Skin:    Coloration: Skin is not pale.     Findings: No rash.  Neurological:     Mental Status: He is alert and oriented to person, place, and time.  Psychiatric:        Mood and Affect: Affect is blunt.        Behavior: Behavior normal.      ED Treatments / Results  Labs (all labs ordered are listed, but only abnormal results are displayed) Labs Reviewed  CBC WITH DIFFERENTIAL/PLATELET - Abnormal; Notable for the following components:      Result Value   WBC 10.7 (*)    Abs Immature Granulocytes 0.08 (*)    All other components within normal limits  COMPREHENSIVE METABOLIC PANEL - Abnormal; Notable for the following components:   Glucose, Bld 105 (*)     Calcium 8.5 (*)    Albumin 3.2 (*)    Alkaline Phosphatase 149 (*)    All other components within normal limits  SARS CORONAVIRUS 2 (HOSPITAL ORDER, PERFORMED IN [facility])  CULTURE, BLOOD (ROUTINE X 2)  CULTURE, BLOOD (ROUTINE X 2)  TSH  URINALYSIS, ROUTINE W REFLEX MICROSCOPIC  LACTIC ACID, PLASMA  T4, FREE    EKG None  Radiology Ct Head Wo Contrast  Result Date: 09/05/2018 CLINICAL DATA:  78 year old male with history of altered mental status and decreased activity for 1 week. EXAM: CT HEAD WITHOUT CONTRAST TECHNIQUE: Contiguous axial images were obtained from the base of the skull  through the vertex without intravenous contrast. COMPARISON:  Head CT 08/22/2018. FINDINGS: Brain: Mild cerebral and cerebellar atrophy. Patchy and confluent areas of decreased attenuation are noted throughout the deep and periventricular white matter of the cerebral hemispheres bilaterally, compatible with chronic microvascular ischemic disease. No evidence of acute infarction, hemorrhage, hydrocephalus, extra-axial collection or mass lesion/mass effect. Vascular: No hyperdense vessel or unexpected calcification. Skull: Normal. Negative for fracture or focal lesion. Sinuses/Orbits: No acute finding. Other: None. IMPRESSION: 1. No acute intracranial abnormalities. 2. Mild cerebral and cerebellar atrophy with chronic microvascular ischemic changes in the cerebral white matter, similar to the prior examination, as above. Electronically Signed   By: Trudie Reed M.D.   On: 09/05/2018 17:13   Dg Chest Port 1 View  Result Date: 09/05/2018 CLINICAL DATA:  78 year old male with decreased activity EXAM: PORTABLE CHEST 1 VIEW COMPARISON:  08/22/2018 FINDINGS: Cardiomediastinal silhouette unchanged in size and contour. Low lung volumes with crowded central vasculature and no interlobular septal thickening. No pneumothorax or pleural effusion. No confluent airspace disease. No displaced fracture  IMPRESSION: Low lung volumes with no evidence of acute cardiopulmonary disease Electronically Signed   By: Gilmer Mor D.O.   On: 09/05/2018 16:18    Procedures Procedures (including critical care time)  Medications Ordered in ED Medications - No data to display   Initial Impression / Assessment and Plan / ED Course  I have reviewed the triage vital signs and the nursing notes.  Pertinent labs & imaging results that were available during my care of the patient were reviewed by me and considered in my medical decision making (see chart for details).        78 yo M with a chief complaint of generalized fatigue.  Going on for the past couple days.  Less active than normal per the nursing facility.  Patient has somewhat of a blunted affect.  Will obtain a laboratory evaluation chest x-ray CT the head TSH reassess.  Work-up here is largely unremarkable patient's UA is negative lactate is normal patient is a very trivial elevation of his white blood cell count.  Rapid coronavirus test is negative CT of the head with no significant finding chest x-ray viewed by me without focal infiltrate or pneumothorax.  8:09 PM:  I have discussed the diagnosis/risks/treatment options with the patient and believe the pt to be eligible for discharge home to follow-up with PCP. We also discussed returning to the ED immediately if new or worsening sx occur. We discussed the sx which are most concerning (e.g., sudden worsening pain, fever, inability to tolerate by mouth) that necessitate immediate return. Medications administered to the patient during their visit and any new prescriptions provided to the patient are listed below.  Medications given during this visit Medications - No data to display   The patient appears reasonably screen and/or stabilized for discharge and I doubt any other medical condition or other [facility] requiring further screening, evaluation, or treatment in the ED at this time prior to  discharge.     Final Clinical Impressions(s) / ED Diagnoses   Final diagnoses:  Weakness    ED Discharge Orders    None       Melene Plan, DO 09/05/18 2009

## 2018-09-05 NOTE — ED Triage Notes (Addendum)
Patient from [facility] independent living. Staff reports decreased activity x1 week. Denies any complaints. Currently being treated for UTI, taking amoxicillin as prescribed. A&Ox4.   Upon assessment patient disoriented to year. Documented hx of dementia.

## 2018-09-05 NOTE — ED Notes (Signed)
ED Provider at bedside. 

## 2018-09-05 NOTE — ED Notes (Signed)
Bed: AL93 Expected date:  Expected time:  Means of arrival:  Comments: 78yo weakness

## 2018-09-05 NOTE — ED Notes (Signed)
Pt repositioned in the bed after found trying to get out of bed. While attempting to reposition pt, pt attempted to kick staff. Pt asked to not kick and given call bell

## 2018-09-05 NOTE — ED Notes (Signed)
Pt repositioned in bed after trying to get up again and has food at bedside to eat

## 2018-09-05 NOTE — Discharge Instructions (Addendum)
Eat and drink well.  Follow up with your doctor.  Return for worsening or change in symptoms.

## 2018-09-10 LAB — CULTURE, BLOOD (ROUTINE X 2)
Culture: NO GROWTH
Culture: NO GROWTH
Special Requests: ADEQUATE

## 2018-10-28 ENCOUNTER — Emergency Department (HOSPITAL_COMMUNITY)
Admission: EM | Admit: 2018-10-28 | Discharge: 2018-10-29 | Disposition: A | Discharge status: Home / Self Care | Attending: Emergency Medicine | Admitting: Emergency Medicine

## 2018-10-28 ENCOUNTER — Other Ambulatory Visit: Payer: Self-pay

## 2018-10-28 DIAGNOSIS — Z79899 Other long term (current) drug therapy: Secondary | ICD-10-CM | POA: Diagnosis not present

## 2018-10-28 DIAGNOSIS — S0990XA Unspecified injury of head, initial encounter: Secondary | ICD-10-CM | POA: Diagnosis present

## 2018-10-28 DIAGNOSIS — Y939 Activity, unspecified: Secondary | ICD-10-CM | POA: Diagnosis not present

## 2018-10-28 DIAGNOSIS — Y92129 Unspecified place in nursing home as the place of occurrence of the external cause: Secondary | ICD-10-CM | POA: Diagnosis not present

## 2018-10-28 DIAGNOSIS — W010XXA Fall on same level from slipping, tripping and stumbling without subsequent striking against object, initial encounter: Secondary | ICD-10-CM | POA: Insufficient documentation

## 2018-10-28 DIAGNOSIS — Z23 Encounter for immunization: Secondary | ICD-10-CM | POA: Insufficient documentation

## 2018-10-28 DIAGNOSIS — S0101XA Laceration without foreign body of scalp, initial encounter: Secondary | ICD-10-CM | POA: Diagnosis not present

## 2018-10-28 DIAGNOSIS — Z8673 Personal history of transient ischemic attack (TIA), and cerebral infarction without residual deficits: Secondary | ICD-10-CM | POA: Insufficient documentation

## 2018-10-28 DIAGNOSIS — I2510 Atherosclerotic heart disease of native coronary artery without angina pectoris: Secondary | ICD-10-CM | POA: Insufficient documentation

## 2018-10-28 DIAGNOSIS — F0280 Dementia in other diseases classified elsewhere without behavioral disturbance: Secondary | ICD-10-CM | POA: Insufficient documentation

## 2018-10-28 DIAGNOSIS — I10 Essential (primary) hypertension: Secondary | ICD-10-CM | POA: Diagnosis not present

## 2018-10-28 DIAGNOSIS — Z7982 Long term (current) use of aspirin: Secondary | ICD-10-CM | POA: Diagnosis not present

## 2018-10-28 DIAGNOSIS — W19XXXA Unspecified fall, initial encounter: Secondary | ICD-10-CM

## 2018-10-28 DIAGNOSIS — Y999 Unspecified external cause status: Secondary | ICD-10-CM | POA: Diagnosis not present

## 2018-10-28 DIAGNOSIS — G301 Alzheimer's disease with late onset: Secondary | ICD-10-CM | POA: Diagnosis not present

## 2018-10-28 NOTE — ED Provider Notes (Signed)
[facility]-EMERGENCY DEPT Provider Note   CSN: [id number] Arrival date & time: 10/28/18  2323    History   Chief Complaint Chief Complaint  Patient presents with  . Head Laceration    HPI Kelly [name] is a 78 y.o. male.   The history is provided by the patient.  Head Laceration  He has history of hypertension, hyperlipidemia, stroke, dementia and comes in following a fall.  He states that he lost his balance and fell and suffered a laceration to the right side of his scalp.  He denies loss of consciousness.  He denies weakness, numbness, tingling.  He does not know when his last tetanus immunization was.  He denies other injury.  Past Medical History:  Diagnosis Date  . Dementia (HCC)   . Hypertension   . Hypertriglyceridemia   . Stroke [facility](HCC)     Patient Active Problem List   Diagnosis Date Noted  . Acute respiratory failure with hypoxia (HCC) 06/23/2018  . 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 Delirium   . Late onset Alzheimer's disease with behavioral disturbance (HCC)   . Fall   . Essential hypertension   . TIA (transient ischemic attack) 04/27/2017  . CVA (cerebral vascular accident) (HCC) 04/27/2017  . 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 CAD (coronary artery disease) 10/26/2010  . Hyperlipidemia 10/26/2010  . SHORTNESS OF BREATH 07/18/2009    Past Surgical History:  Procedure Laterality Date  . HAND SURGERY    . TOTAL HIP ARTHROPLASTY     Left        Home Medications    Prior to Admission medications   Medication Sig Start Date End Date Taking? Authorizing Provider  albuterol (PROVENTIL) (2.5 MG/3ML) 0.083% nebulizer solution Take 3 mLs (2.5 mg total) by nebulization every 2 (two) hours as needed for wheezing. 06/26/18   Marguerita MerlesSheikh, Omair Latif, DO  amoxicillin (AMOXIL) 875 MG tablet Take 875 mg by mouth 2 (two) times daily.    [provider]  aspirin 81 MG chewable tablet Chew 1 tablet (81 mg total) by mouth daily. 09/14/17    Antonieta IbaGollan, Timothy J, MD  atorvastatin (LIPITOR) 40 MG tablet Take 40 mg by mouth at bedtime.     [provider]  clopidogrel (PLAVIX) 75 MG tablet Take 1 tablet (75 mg total) by mouth daily. 09/14/17   Antonieta IbaGollan, Timothy J, MD  divalproex (DEPAKOTE) 250 MG DR tablet Take 500 mg by mouth at bedtime.    [provider]  donepezil (ARICEPT) 10 MG tablet Take 10 mg by mouth at bedtime. 03/15/17   [provider]  losartan (COZAAR) 50 MG tablet Take 50 mg by mouth daily.    [provider]  nystatin (NYSTATIN) powder Apply topically 3 (three) times daily as needed (rash).    [provider]  ondansetron (ZOFRAN) 4 MG tablet Take 1 tablet (4 mg total) by mouth every 6 (six) hours as needed for nausea. 06/26/18   Marguerita MerlesSheikh, Omair Latif, DO  sennosides-docusate sodium (SENOKOT-S) 8.6-50 MG tablet Take 1 tablet by mouth at bedtime as needed for constipation.     [provider]    Family History Family History  Problem Relation Age of Onset  . Heart failure Father   . Hypertension Father     Social History Social History   Tobacco Use  . Smoking status: Never Smoker  . Smokeless tobacco: Never Used  Substance Use Topics  . Alcohol use: No  . Drug use: No  Allergies   Patient has no known allergies.   Review of Systems Review of Systems  All other systems reviewed and are negative.    Physical Exam Updated Vital Signs BP (!) 157/87 (BP Location: Right Arm)   Pulse 91   Temp 98.1 F (36.7 C) (Oral)   Resp 17   SpO2 97% Comment: Simultaneous filing. User may not have seen previous data.  Physical Exam Vitals signs and nursing note reviewed.    78 year old male, resting comfortably and in no acute distress. Vital signs are significant for elevated blood pressure. Oxygen saturation is 97%, which is normal. Head is normocephalic.  There is a laceration of the scalp in the right side of the vertex. PERRLA, EOMI. Oropharynx is clear.  Neck is immobilized in a stiff cervical collar and is without adenopathy or JVD. Back is nontender and there is no CVA tenderness. Lungs are clear without rales, wheezes, or rhonchi. Chest is nontender. Heart has regular rate and rhythm without murmur. Abdomen is soft, flat, nontender without masses or hepatosplenomegaly and peristalsis is normoactive. Extremities have no cyanosis or edema, full range of motion is present. Skin is warm and dry without rash. Neurologic: Mental status is normal, cranial nerves are intact, there are no motor or sensory deficits.  ED Treatments / Results   Radiology Ct Head Wo Contrast  Result Date: 10/29/2018 CLINICAL DATA:  78 year old male with maxillofacial trauma. EXAM: CT HEAD WITHOUT CONTRAST CT CERVICAL SPINE WITHOUT CONTRAST TECHNIQUE: Multidetector CT imaging of the head and cervical spine was performed following the standard protocol without intravenous contrast. Multiplanar CT image reconstructions of the cervical spine were also generated. COMPARISON:  Head CT dated 09/05/2018 FINDINGS: CT HEAD FINDINGS Brain: Moderate age-related atrophy and chronic microvascular ischemic changes. There is no acute intracranial hemorrhage. No mass effect or midline shift. No extra-axial fluid collection. Vascular: No hyperdense vessel or unexpected calcification. Skull: Normal. Negative for fracture or focal lesion. Sinuses/Orbits: No acute finding. Other: Laceration of the right parietal scalp. CT CERVICAL SPINE FINDINGS Alignment: No acute subluxation. Skull base and vertebrae: No acute fracture. Osteopenia. Soft tissues and spinal canal: No prevertebral fluid or swelling. No visible canal hematoma. Disc levels: Multilevel degenerative changes and facet hypertrophy. Multilevel facet ankylosis. Upper chest: Negative. Other: Mild bilateral carotid bulb calcified plaques. IMPRESSION: 1. No acute intracranial hemorrhage. 2. Age-related atrophy and chronic microvascular  ischemic changes. 3. No acute/traumatic cervical spine pathology. Multilevel degenerative changes. Electronically Signed   By: Anner Crete M.D.   On: 10/29/2018 00:52   Ct Cervical Spine Wo Contrast  Result Date: 10/29/2018 CLINICAL DATA:  78 year old male with maxillofacial trauma. EXAM: CT HEAD WITHOUT CONTRAST CT CERVICAL SPINE WITHOUT CONTRAST TECHNIQUE: Multidetector CT imaging of the head and cervical spine was performed following the standard protocol without intravenous contrast. Multiplanar CT image reconstructions of the cervical spine were also generated. COMPARISON:  Head CT dated 09/05/2018 FINDINGS: CT HEAD FINDINGS Brain: Moderate age-related atrophy and chronic microvascular ischemic changes. There is no acute intracranial hemorrhage. No mass effect or midline shift. No extra-axial fluid collection. Vascular: No hyperdense vessel or unexpected calcification. Skull: Normal. Negative for fracture or focal lesion. Sinuses/Orbits: No acute finding. Other: Laceration of the right parietal scalp. CT CERVICAL SPINE FINDINGS Alignment: No acute subluxation. Skull base and vertebrae: No acute fracture. Osteopenia. Soft tissues and spinal canal: No prevertebral fluid or swelling. No visible canal hematoma. Disc levels: Multilevel degenerative changes and facet hypertrophy. Multilevel facet ankylosis. Upper chest: Negative. Other:  Mild bilateral carotid bulb calcified plaques. IMPRESSION: 1. No acute intracranial hemorrhage. 2. Age-related atrophy and chronic microvascular ischemic changes. 3. No acute/traumatic cervical spine pathology. Multilevel degenerative changes. Electronically Signed   By: Elgie CollardArash  Radparvar M.D.   On: 10/29/2018 00:52    Procedures .Marland Kitchen.Laceration Repair  Date/Time: 10/29/2018 1:28 AM Performed by: Kelly [name], Kelly Withers, MD Authorized by: Kelly [name], Davonte Siebenaler, MD   Consent:    Consent obtained:  Verbal   Consent given by:  Patient   Risks discussed:  Infection, pain and poor cosmetic  result   Alternatives discussed:  No treatment Anesthesia (see MAR for exact dosages):    Anesthesia method:  None Laceration details:    Location:  Scalp   Scalp location:  Mid-scalp   Length (cm):  8   Depth (mm):  3 Repair type:    Repair type:  Simple Pre-procedure details:    Preparation:  Patient was prepped and draped in usual sterile fashion and imaging obtained to evaluate for foreign bodies Exploration:    Hemostasis achieved with:  Direct pressure   Wound exploration: entire depth of wound probed and visualized     Wound extent: no foreign bodies/material noted     Contaminated: no   Treatment:    Area cleansed with:  Saline   Amount of cleaning:  Standard Skin repair:    Repair method:  Staples   Number of staples:  11 Approximation:    Approximation:  Close Post-procedure details:    Dressing:  Open (no dressing)   Patient tolerance of procedure:  Tolerated well, no immediate complications    Medications Ordered in ED Medications  Tdap (BOOSTRIX) injection 0.5 mL (has no administration in time range)     Initial Impression / Assessment and Plan / ED Course  I have reviewed the triage vital signs and the nursing notes.  Pertinent imaging results that were available during my care of the patient were reviewed by me and considered in my medical decision making (see chart for details).  Fall with scalp laceration.  He will be sent for CT of head and cervical spine.  Laceration will need staple closure.  Old records are reviewed, and I can find no record of tetanus immunization, so Tdap booster is given.  CT scans show no evidence of acute injury.  Laceration is closed with staples and he is discharged to return to a skilled nursing facility.  Final Clinical Impressions(s) / ED Diagnoses   Final diagnoses:  Fall at nursing home, initial encounter  Scalp laceration, initial encounter    ED Discharge Orders    None       Kelly [name], Kelly Afonso, MD 10/29/18 0129

## 2018-10-28 NOTE — ED Triage Notes (Signed)
Per EMS - Pt had unwitnessed fall with unknown amount of time down. Pt hit his head on nightside and has approx 2 inch lac to top right side of his head. Unknown if LOC. Pt is hospice care. A+OX3  112 78 81 HR 18 R 97% RA CBG 109  97.7

## 2018-10-29 ENCOUNTER — Emergency Department (HOSPITAL_COMMUNITY)

## 2018-10-29 MED ORDER — TETANUS-DIPHTH-ACELL PERTUSSIS 5-2.5-18.5 LF-MCG/0.5 IM SUSP
0.5000 mL | Freq: Once | INTRAMUSCULAR | Status: DC
  Filled 2018-10-29: qty 0.5

## 2018-10-29 NOTE — ED Notes (Signed)
[facility] contacted and paperwork printed  

## 2019-03-13 DEATH — deceased

## 2020-03-18 IMAGING — MR MRI HEAD WITHOUT CONTRAST
10 series · 48 of 48 positions shown · non-contrast
Comparison: Head CT 08/22/2018

CLINICAL DATA: Altered mental status

EXAM:
MRI HEAD WITHOUT CONTRAST
TECHNIQUE: Multiplanar, multiecho pulse sequences of the brain and surrounding
structures were obtained without intravenous contrast.

[Series 5: DWI · axial · 3.0mm · 0.88mm/px · z∈[-38,+100]mm · 12 of 94 slices shown (1 of 4)]
[im 1/94]
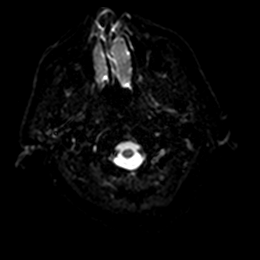
[im 9/94]
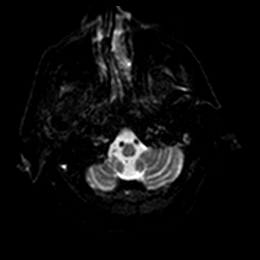
[im 17/94]
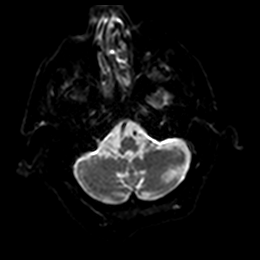
[im 26/94]
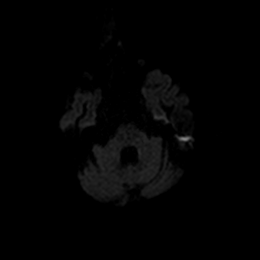
[im 34/94]
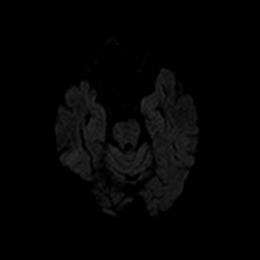
[im 43/94]
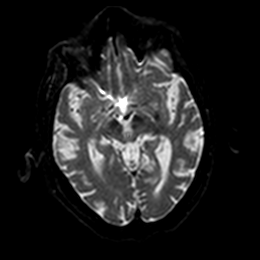
[im 51/94]
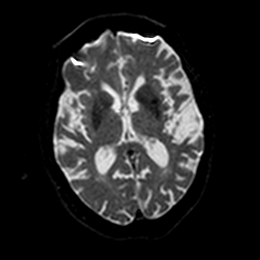
[im 60/94]
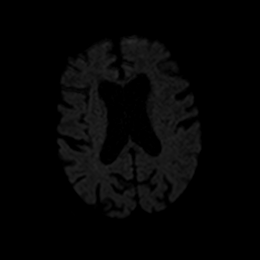
[im 68/94]
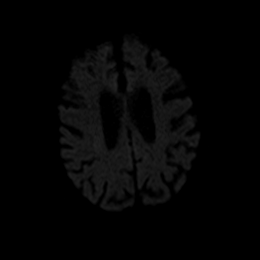
[im 77/94]
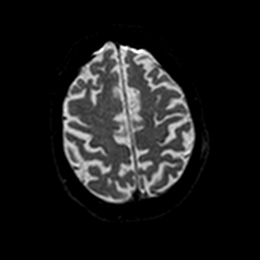
[im 85/94]
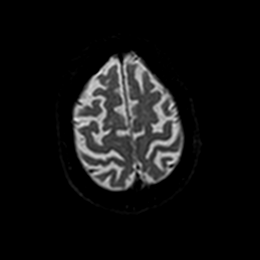
[im 94/94]
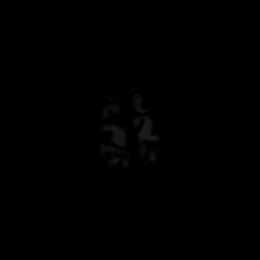

[Series 6: DWI · axial · 3.0mm · 0.88mm/px · z∈[-38,+100]mm · 6 of 47 slices shown (2 of 4)]
[im 1/47]
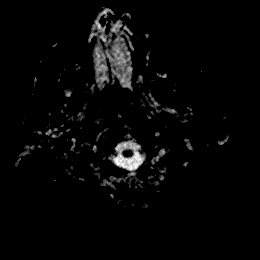
[im 10/47]
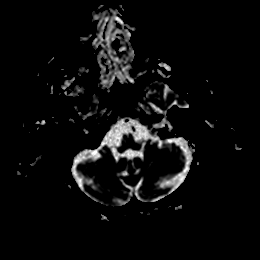
[im 19/47]
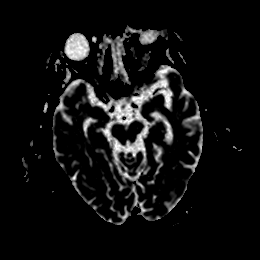
[im 28/47]
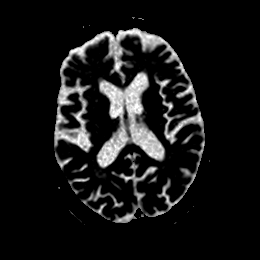
[im 37/47]
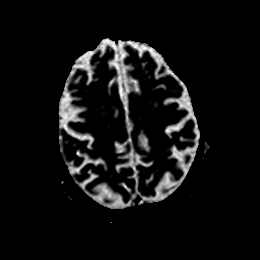
[im 47/47]
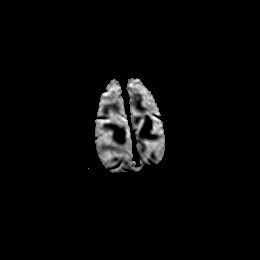

[Series 7: DWI · coronal · 4.0mm · 0.88mm/px · 8 of 70 slices shown (3 of 4)]
[im 1/70]
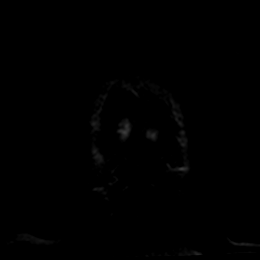
[im 10/70]
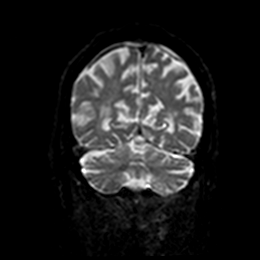
[im 20/70]
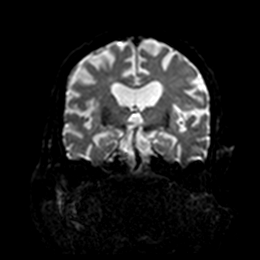
[im 30/70]
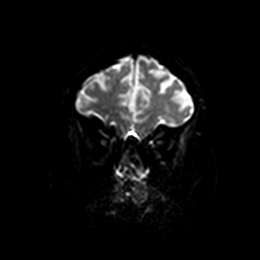
[im 40/70]
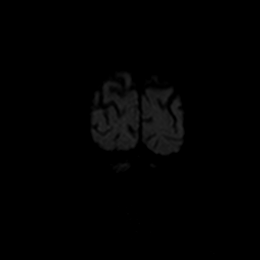
[im 50/70]
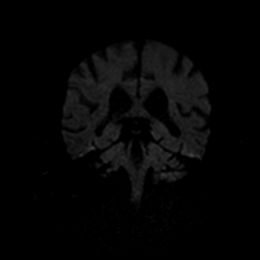
[im 60/70]
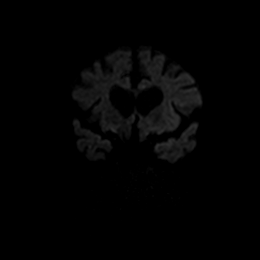
[im 70/70]
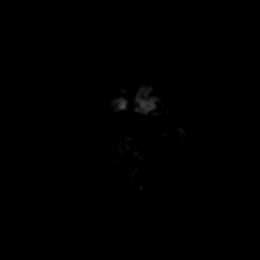

[Series 8: DWI · coronal · 4.0mm · 0.88mm/px · 4 of 34 slices shown (4 of 4)]
[im 1/34]
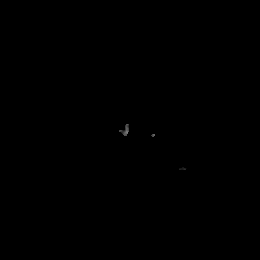
[im 12/34]
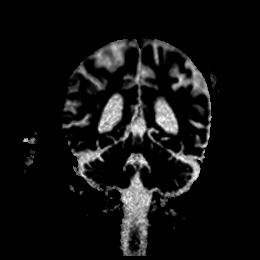
[im 23/34]
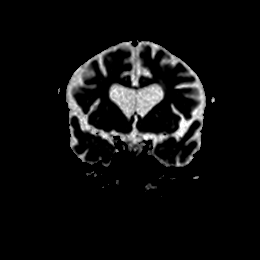
[im 34/34]
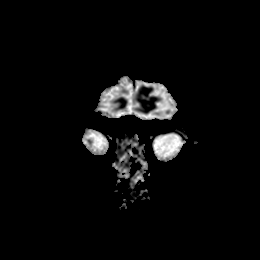

[Series 9: T2 · axial · 5.0mm · 0.72mm/px · z∈[-41,+103]mm · 3 of 25 slices shown]
[im 1/25]
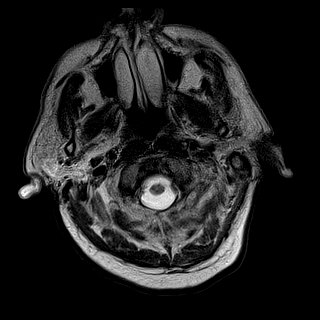
[im 13/25]
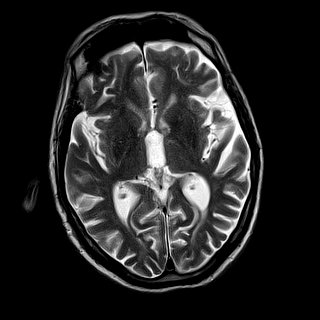
[im 25/25]
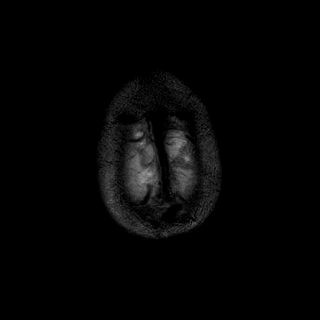

[Series 10: FLAIR · axial · 5.0mm · 0.45mm/px · z∈[-40,+103]mm · 3 of 25 slices shown (1 of 2)]
[im 1/25]
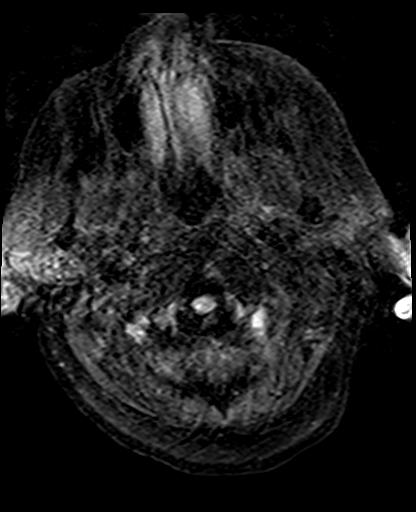
[im 13/25]
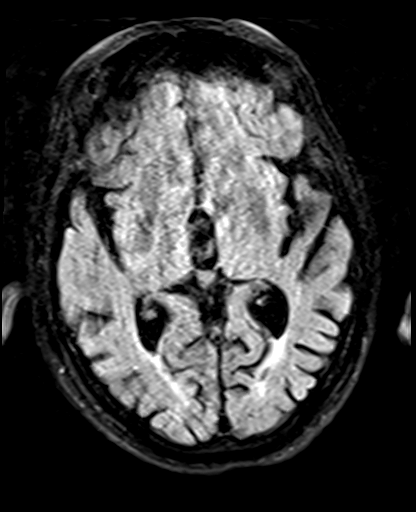
[im 25/25]
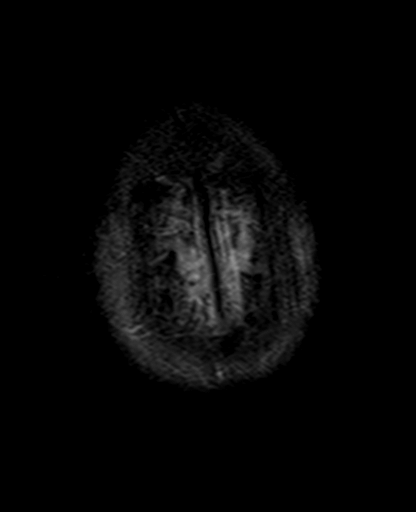

[Series 11: ax hemo · axial · 5.0mm · 0.86mm/px · z∈[-41,+103]mm · 3 of 25 slices shown]
[im 1/25]
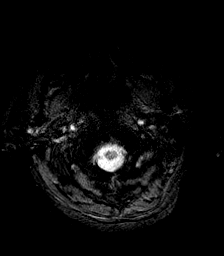
[im 13/25]
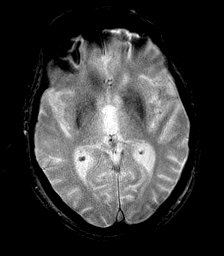
[im 25/25]
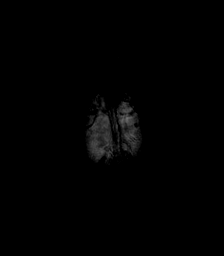

[Series 12: FLAIR · axial · 5.0mm · 0.40mm/px · z∈[-40,+103]mm · 3 of 25 slices shown (2 of 2)]
[im 1/25]
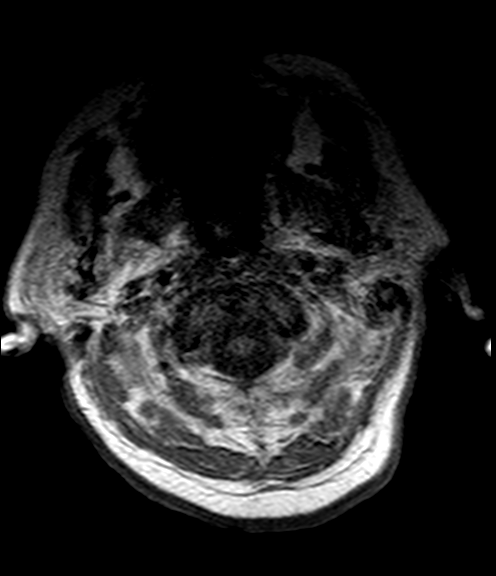
[im 13/25]
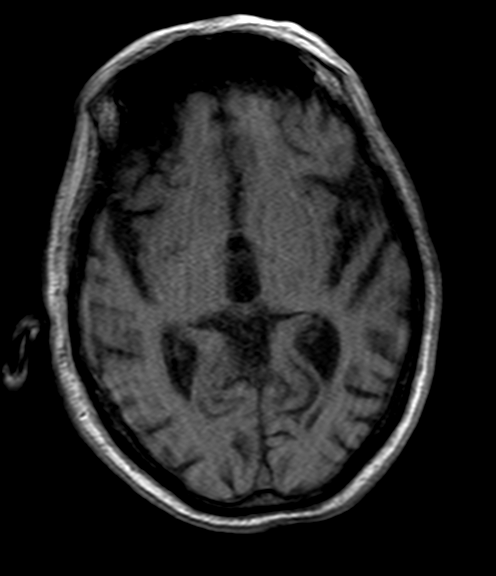
[im 25/25]
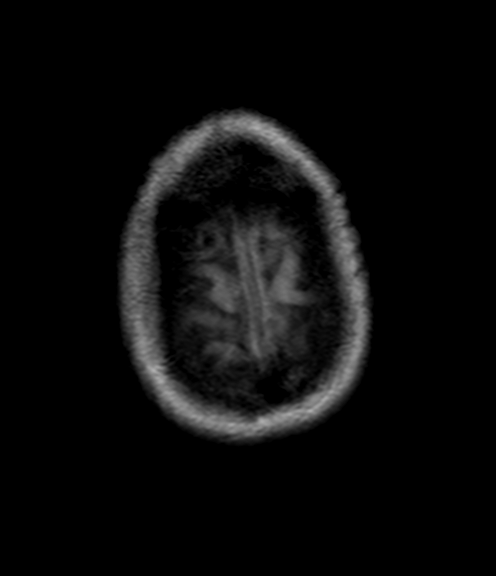

[Series 13: T2 post-contrast · coronal · 5.0mm · 0.72mm/px · 3 of 28 slices shown]
[im 1/28]
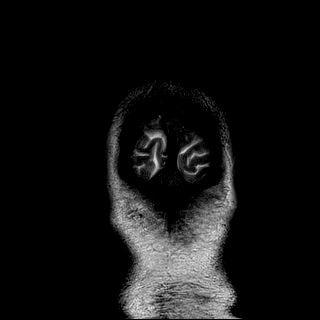
[im 14/28]
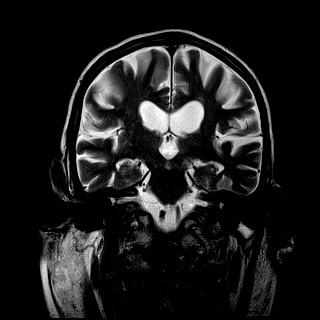
[im 28/28]
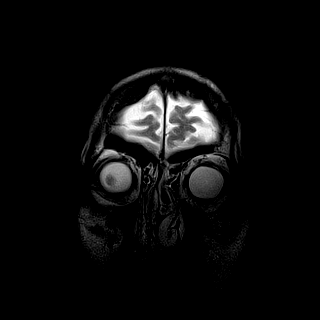

[Series 14: T1 · sagittal · 5.0mm · 0.75mm/px · 3 of 23 slices shown]
[im 1/23]
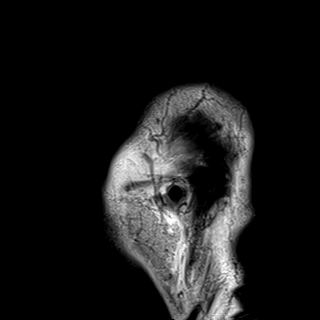
[im 12/23]
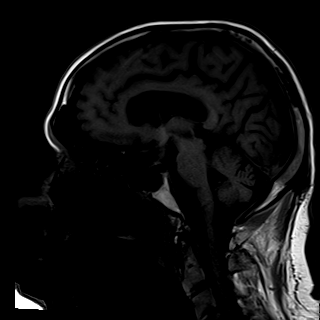
[im 23/23]
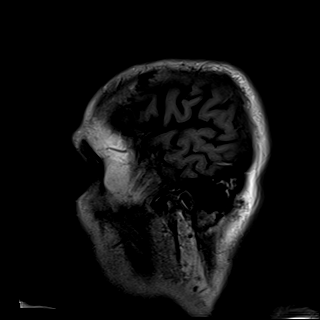

[48 of 48 positions shown; findings below may reference images not displayed]

FINDINGS: BRAIN: There is no acute infarct, acute hemorrhage or extra-axial
collection. The midline structures are normal. No midline shift or
other mass effect. Multifocal white matter hyperintensity, most
commonly due to chronic ischemic microangiopathy. Generalized
atrophy without a clear lobar predilection. Susceptibility-sensitive
sequences show no chronic microhemorrhage or superficial siderosis.
No mass lesion.

VASCULAR: The major intracranial arterial and venous sinus flow
voids are normal.

SKULL AND UPPER CERVICAL SPINE: Calvarial bone marrow signal is
normal. There is no skull base mass. Visualized upper cervical spine
and soft tissues are normal.

SINUSES/ORBITS: No fluid levels or advanced mucosal thickening. No
mastoid or middle ear effusion. The orbits are normal.
IMPRESSION: 1. No acute intracranial abnormality.
2. Advanced generalized atrophy.
